# Patient Record
Sex: Female | Born: 1985 | Race: White | Hispanic: No | Marital: Single | Smoking: Former smoker
Health system: Southern US, Community
[De-identification: ages and names within clinical notes are randomized; demographics above are authoritative.]

## PROBLEM LIST (undated history)

## (undated) ENCOUNTER — Inpatient Hospital Stay: Payer: Self-pay

## (undated) DIAGNOSIS — N12 Tubulo-interstitial nephritis, not specified as acute or chronic: Secondary | ICD-10-CM

## (undated) DIAGNOSIS — E079 Disorder of thyroid, unspecified: Secondary | ICD-10-CM

## (undated) DIAGNOSIS — N200 Calculus of kidney: Secondary | ICD-10-CM

## (undated) DIAGNOSIS — S36113A Laceration of liver, unspecified degree, initial encounter: Secondary | ICD-10-CM

---

## 2004-07-31 ENCOUNTER — Emergency Department: Payer: Self-pay | Admitting: Emergency Medicine

## 2005-02-08 ENCOUNTER — Ambulatory Visit (HOSPITAL_COMMUNITY): Admission: RE | Admit: 2005-02-08 | Discharge: 2005-02-08 | Payer: Self-pay | Admitting: Gynecology

## 2005-02-22 ENCOUNTER — Ambulatory Visit (HOSPITAL_COMMUNITY): Admission: RE | Admit: 2005-02-22 | Discharge: 2005-02-22 | Payer: Self-pay | Admitting: Gynecology

## 2005-07-07 ENCOUNTER — Inpatient Hospital Stay (HOSPITAL_COMMUNITY): Admission: AD | Admit: 2005-07-07 | Discharge: 2005-07-10 | Payer: Self-pay | Admitting: Gynecology

## 2005-07-15 ENCOUNTER — Inpatient Hospital Stay (HOSPITAL_COMMUNITY): Admission: AD | Admit: 2005-07-15 | Discharge: 2005-07-15 | Payer: Self-pay | Admitting: Gynecology

## 2005-08-01 ENCOUNTER — Emergency Department: Payer: Self-pay | Admitting: Unknown Physician Specialty

## 2005-11-09 ENCOUNTER — Encounter: Payer: Self-pay | Admitting: Unknown Physician Specialty

## 2006-05-28 ENCOUNTER — Emergency Department: Payer: Self-pay | Admitting: Emergency Medicine

## 2006-06-04 ENCOUNTER — Emergency Department: Payer: Self-pay | Admitting: Unknown Physician Specialty

## 2009-05-29 ENCOUNTER — Emergency Department: Payer: Self-pay | Admitting: Emergency Medicine

## 2010-07-29 ENCOUNTER — Emergency Department: Payer: Self-pay | Admitting: Emergency Medicine

## 2010-07-30 ENCOUNTER — Emergency Department: Payer: Self-pay | Admitting: Emergency Medicine

## 2010-11-19 ENCOUNTER — Emergency Department: Payer: Self-pay | Admitting: Emergency Medicine

## 2010-12-28 ENCOUNTER — Emergency Department: Payer: Self-pay | Admitting: Emergency Medicine

## 2011-02-07 ENCOUNTER — Ambulatory Visit: Payer: Self-pay | Admitting: Family Medicine

## 2011-04-30 ENCOUNTER — Observation Stay: Payer: Self-pay

## 2011-05-17 ENCOUNTER — Observation Stay: Payer: Self-pay | Admitting: Obstetrics & Gynecology

## 2011-05-27 ENCOUNTER — Inpatient Hospital Stay: Payer: Self-pay | Admitting: Internal Medicine

## 2015-05-16 ENCOUNTER — Encounter: Payer: Self-pay | Admitting: Emergency Medicine

## 2015-05-16 ENCOUNTER — Emergency Department
Admission: EM | Admit: 2015-05-16 | Discharge: 2015-05-16 | Disposition: A | Discharge status: Home / Self Care | Payer: Self-pay | Attending: Emergency Medicine | Admitting: Emergency Medicine

## 2015-05-16 DIAGNOSIS — K0889 Other specified disorders of teeth and supporting structures: Secondary | ICD-10-CM

## 2015-05-16 DIAGNOSIS — Z720 Tobacco use: Secondary | ICD-10-CM | POA: Insufficient documentation

## 2015-05-16 DIAGNOSIS — K088 Other specified disorders of teeth and supporting structures: Secondary | ICD-10-CM | POA: Insufficient documentation

## 2015-05-16 DIAGNOSIS — K011 Impacted teeth: Secondary | ICD-10-CM | POA: Insufficient documentation

## 2015-05-16 DIAGNOSIS — K006 Disturbances in tooth eruption: Secondary | ICD-10-CM

## 2015-05-16 MED ORDER — KETOROLAC TROMETHAMINE 10 MG PO TABS: 10.0000 mg | ORAL_TABLET | Freq: Three times a day (TID) | ORAL | Status: DC

## 2015-05-16 MED ORDER — LIDOCAINE-EPINEPHRINE 2 %-1:100000 IJ SOLN
1.7000 mL | Freq: Once | INTRAMUSCULAR | Status: DC
  Filled 2015-05-16: qty 1.7

## 2015-05-16 NOTE — ED Provider Notes (Signed)
[facility] Emergency Department Provider Note ____________________________________________   Time seen: 1412  I have reviewed the triage vital signs and the nursing notes.  HISTORY  Chief Complaint  Dental Pain  HPI Maureen Herman is a 29 y.o. female reports to the ED for treatment of dental pain 1 week. She describes a right lower wisdom tooth with local gum swelling and tenderness. She denies any fracture, dental cavity, or abscess at this time. She reports pain is worse with chewing and biting. She is not found relief with salt water gargles, ibuprofen, or taking a friend's Vicodin. Here for treatment of this partially erupted wisdom tooth. She rates her pain at a 10/10 in triage.  History reviewed. No pertinent past medical history.  There are no active problems to display for this patient.  History reviewed. No pertinent past surgical history.  Current Outpatient Rx  Name  Route  Sig  Dispense  Refill  . ketorolac (TORADOL) 10 MG tablet   Oral   Take 1 tablet (10 mg total) by mouth every 8 (eight) hours.   15 tablet   0    Allergies Review of patient's allergies indicates no known allergies.  History reviewed. No pertinent family history.  Social History History  Substance Use Topics  . Smoking status: Heavy Tobacco Smoker -- 1.00 packs/day    Types: Cigarettes  . Smokeless tobacco: Not on file  . Alcohol Use: No   Review of Systems  Constitutional: Negative for fever. Eyes: Negative for visual changes. ENT: Negative for sore throat. Dental pain as above.  Cardiovascular: Negative for chest pain. Respiratory: Negative for shortness of breath. Gastrointestinal: Negative for abdominal pain, vomiting and diarrhea. Genitourinary: Negative for dysuria. Musculoskeletal: Negative for back pain. Skin: Negative for rash. Neurological: Negative for headaches, focal weakness or numbness. ____________________________________________  PHYSICAL  EXAM:  VITAL SIGNS: ED Triage Vitals  Enc Vitals Group     BP 05/16/15 1240 112/87 mmHg     Pulse Rate 05/16/15 1240 69     Resp 05/16/15 1240 16     Temp 05/16/15 1240 98.4 F (36.9 C)     Temp src --      SpO2 05/16/15 1240 99 %     Weight 05/16/15 1240 115 lb (52.164 kg)     Height 05/16/15 1240  (1.575 m)     Head Cir --      Peak Flow --      Pain Score 05/16/15 1241 10     Pain Loc --      Pain Edu? --      Excl. in GC? --    Constitutional: Alert and oriented. Well appearing and in no distress. Eyes: Conjunctivae are normal. PERRL. Normal extraocular movements. ENT   Head: Normocephalic and atraumatic.   Nose: No congestion/rhinnorhea.   Mouth/Throat: Mucous membranes are moist. Normal dentition without widespread decay. Right lower 3rd molar with local gum swelling and irritation. Overlying gum flap in place.    Neck: Supple. No thyromegaly. Hematological/Lymphatic/Immunilogical: No cervical lymphadenopathy. Cardiovascular: Normal rate, regular rhythm.  Respiratory: Normal respiratory effort. No wheezes/rales/rhonchi. Musculoskeletal: Nontender with normal range of motion in all extremities.  Neurologic:  Normal gait without ataxia. Normal speech and language. No gross focal neurologic deficits are appreciated. Skin:  Skin is warm, dry and intact. No rash noted. Psychiatric: Mood and affect are normal. Patient exhibits appropriate insight and judgment. ____________________________________________  PROCEDURES  Dental Block  Patient verbal consent obtained. Risks and benefits  reviewed.   Lidocaine 2% w/ epinephrine 1.7 ml  Dental block procedure performed to right lower 3rd molar  (#32)    Patient tolerated the procedure well. Adequate anesthesia obtained.   ____________________________________________  INITIAL IMPRESSION / ASSESSMENT AND PLAN / ED COURSE  Acute dental pain due to partially erupted 3rd molar. Acute pain management with  dental block. Referral to dental provider for definitive treatment. Prescription Toradol provided #15. ____________________________________________  FINAL CLINICAL IMPRESSION(S) / ED DIAGNOSES  Final diagnoses:  Unerupted tooth  Pain, dental     Lissa Hoard, PA-C 05/16/15 1444  Governor Rooks, MD 05/17/15 [phone]

## 2015-05-16 NOTE — ED Notes (Signed)
Right toothache, unable to see dentist until Monday.

## 2015-05-16 NOTE — Discharge Instructions (Signed)
Dental Pain A tooth ache may be caused by cavities (tooth decay). Cavities expose the nerve of the tooth to air and hot or cold temperatures. It may come from an infection or abscess (also called a boil or furuncle) around your tooth. It is also often caused by dental caries (tooth decay). This causes the pain you are having. DIAGNOSIS  Your caregiver can diagnose this problem by exam. TREATMENT   If caused by an infection, it may be treated with medications which kill germs (antibiotics) and pain medications as prescribed by your caregiver. Take medications as directed.  Only take over-the-counter or prescription medicines for pain, discomfort, or fever as directed by your caregiver.  Whether the tooth ache today is caused by infection or dental disease, you should see your dentist as soon as possible for further care. SEEK MEDICAL CARE IF: The exam and treatment you received today has been provided on an emergency basis only. This is not a substitute for complete medical or dental care. If your problem worsens or new problems (symptoms) appear, and you are unable to meet with your dentist, call or return to this location. SEEK IMMEDIATE MEDICAL CARE IF:   You have a fever.  You develop redness and swelling of your face, jaw, or neck.  You are unable to open your mouth.  You have severe pain uncontrolled by pain medicine. MAKE SURE YOU:   Understand these instructions.  Will watch your condition.  Will get help right away if you are not doing well or get worse. Document Released: 10/10/2005 Document Revised: 01/02/2012 Document Reviewed: 05/28/2008 [facility] Patient Information 2015 [place], [place]. This information is not intended to replace advice given to you by your health care provider. Make sure you discuss any questions you have with your health care provider.  Impacted Molar Molars are the teeth in the back of your mouth. You have 12 molars. There are 6 molars in each jaw, 3  on each side. When they grow in (erupt) they sometimes cause problems. Molars trapped inside the gum are impacted molars. Impacted molars may grow sideways, tilted, or may only partially emerge. Molars erupt at different times in life. The first set of molars usually erupts around 69 to 30 years of age. The second set of molars typically erupts around 46 to 30 years of age. The third set of molars are called wisdom teeth. These molars usually do not have enough space to erupt properly. Many teens and young adults develop impacted wisdom teeth and have them surgically removed (extracted). However, any molar or set of molars may become impacted. CAUSES  Teeth that are crowded are often the reason for an impacted molar, but sometimes a cyst or tumor may cause impaction of molars. SYMPTOMS  Sometimes there are no symptoms and an impacted molar is noticed during an exam or X-ray. If there are symptoms they may include:  Pain.  Swelling, redness, or inflammation near the impacted tooth or teeth.  Stiff jaw.  General feeling of illness.  Bad breath.  Gap between the teeth.  Difficulty opening your mouth.  Headache or jaw ache.  Swollen lymph nodes. Impacted teeth may increase the risk of complications such as:  Infection, with possible drainage around the infected area.  Damage to nearby teeth.  Growth of cysts.  Chronic discomfort. DIAGNOSIS  Impacted molars are diagnosed by oral exam and X-rays. TREATMENT  The goal of treatment is to obtain the best possible arrangement of your teeth. Your dentist or orthodontist  will recommend the best course of action for you. After an exam, your caregiver may recommend one or a combination of the following treatments.  Supportive home care to manage pain and other symptoms until treatment can be started.  Surgical extraction of one or a combination of molars to leave room for emerging or later molars. Teeth must be extracted at appropriate times  for the best results.  Surgical uncovering of tissue covering the impacted molar.  Orthodontic repositioning with the use of appliances such as elastic or metal separators, braces, wires, springs, and other removable or fixed devices. This is done to guide the molar and surrounding teeth to grow in properly. In some cases, you may need some surgery to assist this procedure. Follow-up orthodontic treatment is often necessary with impacted first and second molars.  Antibiotics to treat infection. HOME CARE INSTRUCTIONS Rinse as directed with an antibacterial solution or salt and warm water. Follow up with your caregiver as directed, even if you do not have symptoms. If you are waiting for treatment and have pain:  Take pain medicines as directed.  Take your antibiotics as directed. Finish them even if you start to feel better.  Put ice on the affected area.  Put ice in a plastic bag.  Place a towel between your skin and the bag.  Leave the ice on for 15-20 minutes, 03-04 times a day. SEEK DENTAL CARE IF:  You have a fever.  Pain emerges, worsens, or is not controlled by the medicines you were given.  Swelling occurs.  You have difficulty opening your mouth or swallowing. MAKE SURE YOU:   Understand these instructions.  Will watch your condition.  Will get help right away if you are not doing well or get worse. Document Released: 06/08/2011 Document Revised: 01/02/2012 Document Reviewed: 06/08/2011 [facility] Patient Information 2015 [place], [place]. This information is not intended to replace advice given to you by your health care provider. Make sure you discuss any questions you have with your health care provider.  OPTIONS FOR DENTAL FOLLOW UP CARE  [place] [facility] and [facility] - Local Safety Net Dental Clinics [email or web address]   [facility] [phone])  Sharl Ma  [phone])  [place] [phone])  [facility] [phone])  [facility] [phone]) This clinic caters to the indigent population and is on a lottery system. Location: [employer] [facility], [employer], [address], [place] Clinic Hours: Wednesdays from 6pm - 9pm, patients seen by a lottery system. For dates, call or go to [email or web address] Services: Cleanings, fillings and simple extractions. Payment Options: DENTAL WORK IS FREE OF CHARGE. Bring proof of income or support. Best way to get seen: Arrive at 5:15 pm - this is a lottery, NOT first come/first serve, so arriving earlier will not increase your chances of being seen.     [facility] [phone] Select option 1 for emergencies   Location: [facility], [place], [address], [place] Clinic Hours: No walk-ins accepted - call the day before to schedule an appointment. Check in times are 9:30 am and 1:30 pm. Services: Simple extractions, temporary fillings, pulpectomy/pulp debridement, uncomplicated abscess drainage. Payment Options: PAYMENT IS DUE AT THE TIME OF SERVICE.  Fee is usually $100-200, additional surgical procedures (e.g. abscess drainage) may be extra. Cash, checks, Visa/MasterCard accepted.  Can file Medicaid if patient is covered for dental - patient should call case worker to  check. No discount for [facility] patients. Best way to get seen: MUST call the day before and get onto the schedule. Can usually be seen the next 1-2 days. No walk-ins accepted.     [facility] [phone]   Location: [facility], [address] [place] Clinic Hours: M, W, Th, F 8am or 1:30pm, Tues 9a or 1:30 - first come/first served. Services: Simple extractions, temporary fillings, uncomplicated abscess drainage.  You do not need to be  an [facility] resident. Payment Options: PAYMENT IS DUE AT THE TIME OF SERVICE. Dental insurance, otherwise sliding scale - bring proof of income or support. Depending on income and treatment needed, cost is usually $50-200. Best way to get seen: Arrive early as it is first come/first served.     [facility] [phone]   Location: [address] Clinic Hours: Mon-Thu 8a-5p Services: Most basic dental services including extractions and fillings. Payment Options: PAYMENT IS DUE AT THE TIME OF SERVICE. Sliding scale, up to 50% off - bring proof if income or support. Medicaid with dental option accepted. Best way to get seen: Call to schedule an appointment, can usually be seen within 2 weeks OR they will try to see walk-ins - show up at 8a or 2p (you may have to wait).     [facility] [phone] [place] RESIDENTS ONLY   Location: [facility], [address] [place], [place] [address] Clinic Hours: By appointment only. Monday - Thursday 8am-5pm, Friday 8am-12pm Services: Cleanings, fillings, extractions. Payment Options: PAYMENT IS DUE AT THE TIME OF SERVICE. Cash, Visa or MasterCard. Sliding scale - $30 minimum per service. Best way to get seen: Come in to office, complete packet and make an appointment - need proof of income or support monies for each household member and proof of [facility] residence. Usually takes about a month to get in.     [facility] [phone]   Location: [address], [facility] Clinic Hours: Walk-in Urgent Care Dental Services are offered Monday-Friday mornings only. The numbers of emergencies accepted daily is limited to the number of providers available. Maximum 15 - Mondays, Wednesdays & Thursdays Maximum 10 - Tuesdays & Fridays Services: You do not need to be a [facility] resident to be seen for a  dental emergency. Emergencies are defined as pain, swelling, abnormal bleeding, or dental trauma. Walkins will receive x-rays if needed. NOTE: Dental cleaning is not an emergency. Payment Options: PAYMENT IS DUE AT THE TIME OF SERVICE. Minimum co-pay is $40.00 for uninsured patients. Minimum co-pay is $3.00 for Medicaid with dental coverage. Dental Insurance is accepted and must be presented at time of visit. Medicare does not cover dental. Forms of payment: Cash, credit card, checks. Best way to get seen: If not previously registered with the clinic, walk-in dental registration begins at 7:15 am and is on a first come/first serve basis. If previously registered with the clinic, call to make an appointment.     [facility] [phone] [place] RESIDENTS ONLY   Location: [address] [place], [place] Clinic Hours: Mon-Thu 10a-2p Services: Extractions only! Payment Options: FREE (donations accepted) - bring proof of income or support Best way to get seen: Call and schedule an appointment OR come at 8am on the 1st Monday of every month (except for holidays) when it is first come/first served.     [facility] [phone]   Location:  [address] [place], [place] Clinic Hours: Friday mornings Services, Payment Options, Best way to get seen: Call for info

## 2015-05-16 NOTE — ED Notes (Signed)
Dental pain x 1 week - needs dental referral as well

## 2015-05-16 NOTE — ED Notes (Signed)
Pt alert and oriented X4, active, cooperative, pt in NAD. RR even and unlabored, color WNL.  Pt informed to return if any life threatening symptoms occur.   

## 2016-01-02 ENCOUNTER — Emergency Department: Payer: Self-pay

## 2016-01-02 ENCOUNTER — Emergency Department
Admission: EM | Admit: 2016-01-02 | Discharge: 2016-01-02 | Disposition: A | Discharge status: Home / Self Care | Payer: Self-pay | Attending: Emergency Medicine | Admitting: Emergency Medicine

## 2016-01-02 DIAGNOSIS — Z791 Long term (current) use of non-steroidal anti-inflammatories (NSAID): Secondary | ICD-10-CM | POA: Insufficient documentation

## 2016-01-02 DIAGNOSIS — N12 Tubulo-interstitial nephritis, not specified as acute or chronic: Secondary | ICD-10-CM

## 2016-01-02 DIAGNOSIS — N10 Acute tubulo-interstitial nephritis: Secondary | ICD-10-CM | POA: Insufficient documentation

## 2016-01-02 DIAGNOSIS — T39091A Poisoning by salicylates, accidental (unintentional), initial encounter: Secondary | ICD-10-CM | POA: Insufficient documentation

## 2016-01-02 DIAGNOSIS — F17210 Nicotine dependence, cigarettes, uncomplicated: Secondary | ICD-10-CM | POA: Insufficient documentation

## 2016-01-02 LAB — CBC WITH DIFFERENTIAL/PLATELET
BASOS PCT: 0 %
Basophils Absolute: 0 10*3/uL (ref 0–0.1)
EOS ABS: 0.1 10*3/uL (ref 0–0.7)
Eosinophils Relative: 0 %
HEMATOCRIT: 36.7 % (ref 35.0–47.0)
HEMOGLOBIN: 11.9 g/dL — AB (ref 12.0–16.0)
LYMPHS ABS: 1.9 10*3/uL (ref 1.0–3.6)
Lymphocytes Relative: 14 %
MCH: 28 pg (ref 26.0–34.0)
MCHC: 32.4 g/dL (ref 32.0–36.0)
MCV: 86.6 fL (ref 80.0–100.0)
Monocytes Absolute: 1.4 10*3/uL — ABNORMAL HIGH (ref 0.2–0.9)
Monocytes Relative: 11 %
NEUTROS ABS: 10.1 10*3/uL — AB (ref 1.4–6.5)
NEUTROS PCT: 75 %
Platelets: 322 10*3/uL (ref 150–440)
RBC: 4.24 MIL/uL (ref 3.80–5.20)
RDW: 17.2 % — ABNORMAL HIGH (ref 11.5–14.5)
WBC: 13.5 10*3/uL — AB (ref 3.6–11.0)

## 2016-01-02 LAB — URINALYSIS COMPLETE WITH MICROSCOPIC (ARMC ONLY)
BILIRUBIN URINE: NEGATIVE
Glucose, UA: NEGATIVE mg/dL
Nitrite: POSITIVE — AB
PH: 5 (ref 5.0–8.0)
PROTEIN: 100 mg/dL — AB
Specific Gravity, Urine: 1.021 (ref 1.005–1.030)

## 2016-01-02 LAB — BASIC METABOLIC PANEL
ANION GAP: 7 (ref 5–15)
BUN: 14 mg/dL (ref 6–20)
CHLORIDE: 107 mmol/L (ref 101–111)
CO2: 22 mmol/L (ref 22–32)
Calcium: 8.9 mg/dL (ref 8.9–10.3)
Creatinine, Ser: 0.74 mg/dL (ref 0.44–1.00)
GFR calc non Af Amer: >60 mL/min (ref >60)
Glucose, Bld: 90 mg/dL (ref 65–99)
POTASSIUM: 3.5 mmol/L (ref 3.5–5.1)
SODIUM: 136 mmol/L (ref 135–145)

## 2016-01-02 LAB — ACETAMINOPHEN LEVEL

## 2016-01-02 LAB — POCT PREGNANCY, URINE: Preg Test, Ur: NEGATIVE

## 2016-01-02 LAB — SALICYLATE LEVEL: SALICYLATE LVL: 31.8 mg/dL — AB (ref 2.8–30.0)

## 2016-01-02 MED ORDER — KETOROLAC TROMETHAMINE 30 MG/ML IJ SOLN
30.0000 mg | Freq: Once | INTRAMUSCULAR | Status: AC
  Administered 2016-01-02: 30 mg via INTRAVENOUS
  Filled 2016-01-02: qty 1

## 2016-01-02 MED ORDER — SODIUM CHLORIDE 0.9 % IV BOLUS (SEPSIS)
1000.0000 mL | Freq: Once | INTRAVENOUS | Status: AC
  Administered 2016-01-02: 1000 mL via INTRAVENOUS

## 2016-01-02 MED ORDER — CEPHALEXIN 500 MG PO CAPS: 500.0000 mg | ORAL_CAPSULE | Freq: Three times a day (TID) | ORAL | Status: AC

## 2016-01-02 MED ORDER — DEXTROSE 5 % IV SOLN
1.0000 g | INTRAVENOUS | Status: DC
  Administered 2016-01-02: 1 g via INTRAVENOUS
  Filled 2016-01-02: qty 10

## 2016-01-02 NOTE — ED Notes (Signed)
Patient left for ultrasound.

## 2016-01-02 NOTE — Discharge Instructions (Signed)
Return immediately for further blood testing for aspirin overdose. Aspirin Overdose Aspirin is a medicine that is commonly used to treat fever, pain, and inflammation. It is found in many prescription medicines and over-the-counter medicines, including:  Pain medicines.  Cold medicines.  Medicines for diarrhea or upset stomach.  Herbal medicines. A person who has overdosed on aspirin has taken too much aspirin or too much of a medicine that is like aspirin. He or she may have taken too much at one time (acute overdose) or over a long period of time (chronic overdose). An aspirin overdose can result in serious health problems, such as vomiting and organ failure. RISK FACTORS Risk factors of an aspirin overdose include:  Being elderly.  Not drinking enough fluids.  Becoming overheated.  Having a kidney problem. SYMPTOMS Symptoms of an aspirin overdose include:  Nausea and vomiting.  Fever.  Ringing in the ears.  Confusion.  Hyperactivity.  Fatigue.  Rapid breathing or difficulty breathing.  Low blood pressure.  Seizure.  Loss of consciousness. DIAGNOSIS An aspirin overdose can be diagnosed based on:  Symptoms.  A history of having taken aspirin.  A physical exam.  Tests. These may check:  The amount of aspirin in the blood.  Oxygen in the blood (ABG).  Acid levels (pH) in the blood and urine.  Blood minerals (electrolytes).  Blood sugar (glucose).  The amount of protein in the blood and urine. TREATMENT Treatment depends on how severe the overdose is, when aspirin was last taken, and how much was taken. Treatment may involve:  Activated charcoal to absorb aspirin in the stomach. The charcoal may be given to swallow or given through a tube that goes from the nose to the stomach (nasogastric tube).  IV fluids and medicines to correct blood pH and restore electrolyte balance.  Antiseizure medicines.  Laxatives to help remove aspirin from the  body.  Cooling the body with an ice bath to bring down fever.  A procedure to filter the aspirin from the blood (hemodialysis). This may be needed in severe cases. HOME CARE INSTRUCTIONS  Drink enough fluid to keep your urine clear or pale yellow.  Tell your health care provider if you continue to have symptoms of an aspirin overdose. PREVENTION  Carefully read the labels of all medicines that you take. These include prescription medicines, over-the-counter medicines, herbal supplements, and vitamins.  Check with your health care provider or pharmacist before you start any new medicine or supplement.   This information is not intended to replace advice given to you by your health care provider. Make sure you discuss any questions you have with your health care provider.   Document Released: 11/17/2004 Document Revised: 10/31/2014 Document Reviewed: 07/29/2014 Elsevier Interactive Patient Education 2016 Elsevier Inc.  Pyelonephritis, Adult Pyelonephritis is a kidney infection. The kidneys are organs that help clean your blood by moving waste out of your blood and into your pee (urine). This infection can happen quickly, or it can last for a long time. In most cases, it clears up with treatment and does not cause other problems. HOME CARE Medicines  Take over-the-counter and prescription medicines only as told by your doctor.  Take your antibiotic medicine as told by your doctor. Do not stop taking the medicine even if you start to feel better. General Instructions  Drink enough fluid to keep your pee clear or pale yellow.  Avoid caffeine, tea, and carbonated drinks.  Pee (urinate) often. Avoid holding in pee for long periods of time.  Pee before and after sex.  After pooping (having a bowel movement), women should wipe from front to back. Use each tissue only once.  Keep all follow-up visits as told by your doctor. This is important. GET HELP IF:  You do not feel better  after 2 days.  Your symptoms get worse.  You have a fever. GET HELP RIGHT AWAY IF:  You cannot take your medicine or drink fluids as told.  You have chills and shaking.  You throw up (vomit).  You have very bad pain in your side (flank) or back.  You feel very weak or you pass out (faint).   This information is not intended to replace advice given to you by your health care provider. Make sure you discuss any questions you have with your health care provider.   Document Released: 11/17/2004 Document Revised: 07/01/2015 Document Reviewed: 02/02/2015 Elsevier Interactive Patient Education [employer] Elsevier Inc.

## 2016-01-02 NOTE — ED Notes (Signed)
Left flank pain for 2 weeks that comes and goes. Denies vaginal discharge or burning with urination. Hx of kidney stones

## 2016-01-02 NOTE — ED Provider Notes (Addendum)
[facility] Emergency Department Provider Note  ____________________________________________  Time seen: Approximately 1145PM  I have reviewed the triage vital signs and the nursing notes.   HISTORY  Chief Complaint Flank Pain    HPI Maureen Herman is a 30 y.o. female with a history of kidney infection as well as kidney stones who is presenting with left flank pain. She says that last week she felt that she had a urinary tract infection was having urinary frequency but take over-the-counter Azo and the symptoms resolved. However, she says that she has had left flank pain which is of a stabbing quality. She says it has been slowly increasing over the past 2 weeks. She denies any vaginal bleeding or discharge at this time. Says that she has never needed any sort of intervention for her kidney stones.  Patient says that these symptoms feel different from her previous kidney stones because of the pain being ongoing and progressive. Says that the last time she had stones the pain was of a sudden onset quality.   No past medical history on file.  There are no active problems to display for this patient.   No past surgical history on file.  Current Outpatient Rx  Name  Route  Sig  Dispense  Refill  . ketorolac (TORADOL) 10 MG tablet   Oral   Take 1 tablet (10 mg total) by mouth every 8 (eight) hours.   15 tablet   0     Allergies Review of patient's allergies indicates no known allergies.  No family history on file.  Social History Social History  Substance Use Topics  . Smoking status: Heavy Tobacco Smoker -- 1.00 packs/day    Types: Cigarettes  . Smokeless tobacco: Not on file  . Alcohol Use: No    Review of Systems Constitutional: No fever/chills Eyes: No visual changes. ENT: No sore throat. Cardiovascular: Denies chest pain. Respiratory: Denies shortness of breath. Gastrointestinal: No abdominal pain.  No nausea, no vomiting.  No diarrhea.   No constipation. Genitourinary: Negative for dysuria. Musculoskeletal: Left flank pain. Skin: Negative for rash. Neurological: Negative for headaches, focal weakness or numbness.  10-point ROS otherwise negative.  ____________________________________________   PHYSICAL EXAM:  VITAL SIGNS: ED Triage Vitals  Enc Vitals Group     BP 01/02/16 1114 135/76 mmHg     Pulse Rate 01/02/16 1114 111     Resp 01/02/16 1114 20     Temp 01/02/16 1114 98.2 F (36.8 C)     Temp Source 01/02/16 1114 Oral     SpO2 01/02/16 1114 98 %     Weight 01/02/16 1114 100 lb (45.36 kg)     Height 01/02/16 1114  (1.575 m)     Head Cir --      Peak Flow --      Pain Score 01/02/16 1204 6     Pain Loc --      Pain Edu? --      Excl. in GC? --     Constitutional: Alert and oriented. Well appearing and in no acute distress. Eyes: Conjunctivae are normal. PERRL. EOMI. Head: Atraumatic. Nose: No congestion/rhinnorhea. Mouth/Throat: Mucous membranes are moist.  Neck: No stridor.   Cardiovascular: Normal rate, regular rhythm. Grossly normal heart sounds.  Good peripheral circulation. Respiratory: Normal respiratory effort.  No retractions. Lungs CTAB. Gastrointestinal: Soft and nontender. No distention. No abdominal bruits. Left sided CVA tenderness to palpation. Musculoskeletal: No lower extremity tenderness nor edema.  No joint  effusions. Neurologic:  Normal speech and language. No gross focal neurologic deficits are appreciated.  Skin:  Skin is warm, dry and intact. No rash noted. Psychiatric: Mood and affect are normal. Speech and behavior are normal.  ____________________________________________   LABS (all labs ordered are listed, but only abnormal results are displayed)  Labs Reviewed  URINALYSIS COMPLETEWITH MICROSCOPIC ([facility] ONLY) - Abnormal; Notable for the following:    Color, Urine YELLOW (*)    APPearance CLOUDY (*)    Ketones, ur TRACE (*)    Hgb urine dipstick 1+ (*)     Protein, ur 100 (*)    Nitrite POSITIVE (*)    Leukocytes, UA 1+ (*)    Bacteria, UA RARE (*)    Squamous Epithelial / LPF 6-30 (*)    All other components within normal limits  CBC WITH DIFFERENTIAL/PLATELET - Abnormal; Notable for the following:    WBC 13.5 (*)    Hemoglobin 11.9 (*)    RDW 17.2 (*)    Neutro Abs 10.1 (*)    Monocytes Absolute 1.4 (*)    All other components within normal limits  SALICYLATE LEVEL - Abnormal; Notable for the following:    Salicylate Lvl 31.8 (*)    All other components within normal limits  ACETAMINOPHEN LEVEL - Abnormal; Notable for the following:    Acetaminophen (Tylenol), Serum <10 (*)    All other components within normal limits  URINE CULTURE  BASIC METABOLIC PANEL  SALICYLATE LEVEL  POC URINE PREG, ED  POCT PREGNANCY, URINE   ____________________________________________  EKG   ____________________________________________  RADIOLOGY   IMPRESSION: No evidence of hydronephrosis.  Questionable cortical thinning of the bilateral kidneys with hyperechoic appearance of the kidney pyramids. Ultrasound characteristics are nonspecific, and could potentially relate to chronic inflammation/infection, primary renal abnormality (such as nephrocalcinosis/medullary sponge kidney) or other metabolic problems. If the patient has not yet been evaluated by nephrology, referral may be considered.  Signed,  Yvone Neu. Loreta Ave, DO  [facility]  [facility]   Electronically Signed By: Gilmer Mor D.O. On: 01/02/2016 12:47       ____________________________________________   PROCEDURES   ____________________________________________   INITIAL IMPRESSION / ASSESSMENT AND PLAN / ED COURSE  Pertinent labs & imaging results that were available during my care of the patient were reviewed by me and considered in my medical decision making (see chart for  details).  ----------------------------------------- 3:15 PM on 01/02/2016 -----------------------------------------  Patient with elevated salicylate level. Salicylate level sent because the patient admitted to taking 18 BC packets over the past 24 hours because of her pain. Ulcer was called who recommended a 6 hour level for repeat study. However, the patient says that she has to leave to pick up her children. She says that she was unaware that the [facility] powder could hurt her. I counseled her regarding her lab as well as imaging results. She knows that if she does not stay for further testing that this may make her susceptible to kidney failure, dialysis, permanent disability or death. Despite this she said that she must go pick up her children. She says that she will be able to return as soon as possible and effort before 6 PM to have her levels redrawn. She is finishing her Rocephin now. She will be discharged with Keflex for her pyelonephritis. Unlikely to be infected kidney stone. Symptoms are consistent with pyelonephritis. No sudden onset of pain as the patient has had in the past with her kidney  stones. Also, patient is calm in the bed. Has not had any pain medications in the emergency department. Is not having any difficulty finding a position of comfort. ____________________________________________   FINAL CLINICAL IMPRESSION(S) / ED DIAGNOSES  Elevated salicylate level. Pyelonephritis.    Myrna Blazeravid Matthew Kayde Atkerson, MD 01/02/16 1517  Prior to discharge the patient was fully awake and alert. She was not clinically intoxicated and had decisional capacity to make medical decisions. She was aware of all the risks of leaving AGAINST MEDICAL ADVICE.  Myrna Blazeravid Matthew Hildreth Orsak, MD 01/02/16 [phone]

## 2016-01-02 NOTE — ED Notes (Signed)
Patient states she needs to leave to pick up her daughter. Dr. Pershing ProudSchaevitz is aware.

## 2016-01-02 NOTE — ED Notes (Signed)
Patient states she had burning with urination last week, but that was resolved with OTC meds. Patient states OTC BC helps with the left flank pain. Patient states she has taken approximately 18 in the last 24 hours.

## 2016-01-04 LAB — URINE CULTURE

## 2016-01-09 ENCOUNTER — Emergency Department: Payer: Self-pay

## 2016-01-09 ENCOUNTER — Ambulatory Visit (HOSPITAL_COMMUNITY)
Admission: AD | Admit: 2016-01-09 | Discharge: 2016-01-09 | Disposition: A | Discharge status: Home / Self Care | Payer: No Typology Code available for payment source | Source: Other Acute Inpatient Hospital | Attending: Emergency Medicine | Admitting: Emergency Medicine

## 2016-01-09 ENCOUNTER — Emergency Department
Admission: EM | Admit: 2016-01-09 | Discharge: 2016-01-09 | Disposition: A | Discharge status: Other Acute Inpatient Hospital | Payer: Self-pay | Attending: Emergency Medicine | Admitting: Emergency Medicine

## 2016-01-09 DIAGNOSIS — Y998 Other external cause status: Secondary | ICD-10-CM | POA: Insufficient documentation

## 2016-01-09 DIAGNOSIS — S36113A Laceration of liver, unspecified degree, initial encounter: Secondary | ICD-10-CM | POA: Diagnosis present

## 2016-01-09 DIAGNOSIS — Y9389 Activity, other specified: Secondary | ICD-10-CM | POA: Insufficient documentation

## 2016-01-09 DIAGNOSIS — F1099 Alcohol use, unspecified with unspecified alcohol-induced disorder: Secondary | ICD-10-CM | POA: Insufficient documentation

## 2016-01-09 DIAGNOSIS — Y92410 Unspecified street and highway as the place of occurrence of the external cause: Secondary | ICD-10-CM | POA: Insufficient documentation

## 2016-01-09 DIAGNOSIS — S36115A Moderate laceration of liver, initial encounter: Secondary | ICD-10-CM | POA: Insufficient documentation

## 2016-01-09 DIAGNOSIS — F17210 Nicotine dependence, cigarettes, uncomplicated: Secondary | ICD-10-CM | POA: Insufficient documentation

## 2016-01-09 LAB — COMPREHENSIVE METABOLIC PANEL
ALBUMIN: 3.7 g/dL (ref 3.5–5.0)
ALK PHOS: 58 U/L (ref 38–126)
ALT: 515 U/L — AB (ref 14–54)
AST: 676 U/L — AB (ref 15–41)
Anion gap: 13 (ref 5–15)
BUN: 11 mg/dL (ref 6–20)
CALCIUM: 8.4 mg/dL — AB (ref 8.9–10.3)
CHLORIDE: 106 mmol/L (ref 101–111)
CO2: 20 mmol/L — AB (ref 22–32)
CREATININE: 0.7 mg/dL (ref 0.44–1.00)
GFR calc non Af Amer: >60 mL/min (ref >60)
GLUCOSE: 75 mg/dL (ref 65–99)
Potassium: 2.7 mmol/L — CL (ref 3.5–5.1)
SODIUM: 139 mmol/L (ref 135–145)
Total Bilirubin: 0.5 mg/dL (ref 0.3–1.2)
Total Protein: 7.3 g/dL (ref 6.5–8.1)

## 2016-01-09 LAB — ETHANOL: Alcohol, Ethyl (B): 257 mg/dL — ABNORMAL HIGH (ref <5)

## 2016-01-09 LAB — TYPE AND SCREEN
ABO/RH(D): O NEG
ANTIBODY SCREEN: NEGATIVE
UNIT DIVISION: 0

## 2016-01-09 LAB — CBC
HCT: 36.1 % (ref 35.0–47.0)
HEMOGLOBIN: 11.6 g/dL — AB (ref 12.0–16.0)
MCH: 28.1 pg (ref 26.0–34.0)
MCHC: 32.3 g/dL (ref 32.0–36.0)
MCV: 87 fL (ref 80.0–100.0)
PLATELETS: 482 10*3/uL — AB (ref 150–440)
RBC: 4.14 MIL/uL (ref 3.80–5.20)
RDW: 16.5 % — ABNORMAL HIGH (ref 11.5–14.5)
WBC: 28.5 10*3/uL — AB (ref 3.6–11.0)

## 2016-01-09 LAB — SALICYLATE LEVEL

## 2016-01-09 LAB — PREPARE RBC (CROSSMATCH)

## 2016-01-09 LAB — ABO/RH: ABO/RH(D): O NEG

## 2016-01-09 LAB — ACETAMINOPHEN LEVEL: Acetaminophen (Tylenol), Serum: <10 ug/mL — ABNORMAL LOW (ref 10–30)

## 2016-01-09 MED ORDER — POTASSIUM CHLORIDE 10 MEQ/100ML IV SOLN
10.0000 meq | INTRAVENOUS | Status: DC
  Administered 2016-01-09: 10 meq via INTRAVENOUS
  Filled 2016-01-09 (×2): qty 100

## 2016-01-09 MED ORDER — SODIUM CHLORIDE 0.9 % IV BOLUS (SEPSIS)
1000.0000 mL | Freq: Once | INTRAVENOUS | Status: AC
  Administered 2016-01-09: 1000 mL via INTRAVENOUS

## 2016-01-09 MED ORDER — LORAZEPAM 2 MG/ML IJ SOLN
1.0000 mg | Freq: Once | INTRAMUSCULAR | Status: AC
  Administered 2016-01-09: 1 mg via INTRAVENOUS

## 2016-01-09 MED ORDER — LORAZEPAM 2 MG/ML IJ SOLN
1.0000 mg | Freq: Once | INTRAMUSCULAR | Status: AC
  Administered 2016-01-09: 1 mg via INTRAVENOUS
  Filled 2016-01-09: qty 1

## 2016-01-09 MED ORDER — SODIUM CHLORIDE 0.9 % IV SOLN: 10.0000 mL/h | Freq: Once | INTRAVENOUS | Status: DC

## 2016-01-09 MED ORDER — LORAZEPAM 2 MG/ML IJ SOLN
INTRAMUSCULAR | Status: AC
  Filled 2016-01-09: qty 1

## 2016-01-09 MED ORDER — IOHEXOL 350 MG/ML SOLN
100.0000 mL | Freq: Once | INTRAVENOUS | Status: AC | PRN
  Administered 2016-01-09: 100 mL via INTRAVENOUS

## 2016-01-09 MED ORDER — MORPHINE SULFATE (PF) 4 MG/ML IV SOLN
4.0000 mg | Freq: Once | INTRAVENOUS | Status: AC
  Administered 2016-01-09: 4 mg via INTRAVENOUS
  Filled 2016-01-09: qty 1

## 2016-01-09 NOTE — ED Notes (Signed)
Patient transported to CT 

## 2016-01-09 NOTE — ED Notes (Signed)
Patient returned to room.  Unable to have CT performed due to patient movement.

## 2016-01-09 NOTE — ED Notes (Signed)
Dr. Frances MaywoodA Webster notified of critical lab value - Potassium 2.7.  Acknowledged, no new order received.

## 2016-01-09 NOTE — ED Notes (Signed)
Report given to [facility], RN

## 2016-01-09 NOTE — ED Notes (Signed)
[facility]

## 2016-01-09 NOTE — ED Notes (Signed)
Type and screen sent to lab.

## 2016-01-09 NOTE — ED Notes (Signed)
[facility] at bedside 

## 2016-01-09 NOTE — ED Notes (Signed)
Necklace - silver colored chain, owl shaped charm with clear stones at the eyes and black colored stone in the center. Belly button piercing - silver colored with silver colored ball at the top and white colored ball with clear colored stones on the bottom. Placed in specimen cup and placed with patient's other belongings.
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
## 2016-02-09 ENCOUNTER — Encounter: Payer: Self-pay | Admitting: Emergency Medicine

## 2016-02-09 ENCOUNTER — Emergency Department
Admission: EM | Admit: 2016-02-09 | Discharge: 2016-02-09 | Disposition: A | Discharge status: Home / Self Care | Payer: No Typology Code available for payment source | Attending: Emergency Medicine | Admitting: Emergency Medicine

## 2016-02-09 ENCOUNTER — Emergency Department: Payer: No Typology Code available for payment source

## 2016-02-09 DIAGNOSIS — R0789 Other chest pain: Secondary | ICD-10-CM

## 2016-02-09 DIAGNOSIS — F17210 Nicotine dependence, cigarettes, uncomplicated: Secondary | ICD-10-CM | POA: Insufficient documentation

## 2016-02-09 HISTORY — DX: Disorder of thyroid, unspecified: E07.9

## 2016-02-09 LAB — POCT PREGNANCY, URINE: Preg Test, Ur: NEGATIVE

## 2016-02-09 MED ORDER — KETOROLAC TROMETHAMINE 30 MG/ML IJ SOLN
30.0000 mg | Freq: Once | INTRAMUSCULAR | Status: AC
  Administered 2016-02-09: 30 mg via INTRAMUSCULAR
  Filled 2016-02-09: qty 1

## 2016-02-09 MED ORDER — DIAZEPAM 2 MG PO TABS
2.0000 mg | ORAL_TABLET | Freq: Once | ORAL | Status: AC
  Administered 2016-02-09: 2 mg via ORAL
  Filled 2016-02-09: qty 1
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
## 2016-02-09 NOTE — Discharge Instructions (Signed)

## 2016-03-09 ENCOUNTER — Emergency Department: Payer: Self-pay

## 2016-03-09 ENCOUNTER — Emergency Department
Admission: EM | Admit: 2016-03-09 | Discharge: 2016-03-09 | Disposition: A | Discharge status: Home / Self Care | Payer: Self-pay | Attending: Emergency Medicine | Admitting: Emergency Medicine

## 2016-03-09 ENCOUNTER — Encounter: Payer: Self-pay | Admitting: Emergency Medicine

## 2016-03-09 DIAGNOSIS — R1010 Upper abdominal pain, unspecified: Secondary | ICD-10-CM

## 2016-03-09 DIAGNOSIS — F1290 Cannabis use, unspecified, uncomplicated: Secondary | ICD-10-CM | POA: Insufficient documentation

## 2016-03-09 DIAGNOSIS — R0789 Other chest pain: Secondary | ICD-10-CM

## 2016-03-09 DIAGNOSIS — R112 Nausea with vomiting, unspecified: Secondary | ICD-10-CM

## 2016-03-09 DIAGNOSIS — R067 Sneezing: Secondary | ICD-10-CM | POA: Insufficient documentation

## 2016-03-09 DIAGNOSIS — R1011 Right upper quadrant pain: Secondary | ICD-10-CM | POA: Insufficient documentation

## 2016-03-09 DIAGNOSIS — Z87891 Personal history of nicotine dependence: Secondary | ICD-10-CM | POA: Insufficient documentation

## 2016-03-09 LAB — CBC WITH DIFFERENTIAL/PLATELET
BASOS ABS: 0 10*3/uL (ref 0–0.1)
BASOS PCT: 1 %
EOS ABS: 0.1 10*3/uL (ref 0–0.7)
Eosinophils Relative: 2 %
HEMATOCRIT: 35.4 % (ref 35.0–47.0)
HEMOGLOBIN: 11.3 g/dL — AB (ref 12.0–16.0)
Lymphocytes Relative: 26 %
Lymphs Abs: 1.4 10*3/uL (ref 1.0–3.6)
MCH: 28.1 pg (ref 26.0–34.0)
MCHC: 32 g/dL (ref 32.0–36.0)
MCV: 87.8 fL (ref 80.0–100.0)
MONO ABS: 0.5 10*3/uL (ref 0.2–0.9)
MONOS PCT: 8 %
NEUTROS ABS: 3.4 10*3/uL (ref 1.4–6.5)
NEUTROS PCT: 63 %
Platelets: 312 10*3/uL (ref 150–440)
RBC: 4.03 MIL/uL (ref 3.80–5.20)
RDW: 16.6 % — AB (ref 11.5–14.5)
WBC: 5.4 10*3/uL (ref 3.6–11.0)

## 2016-03-09 LAB — COMPREHENSIVE METABOLIC PANEL
ALK PHOS: 67 U/L (ref 38–126)
ALT: 15 U/L (ref 14–54)
ANION GAP: 7 (ref 5–15)
AST: 17 U/L (ref 15–41)
Albumin: 4.2 g/dL (ref 3.5–5.0)
BILIRUBIN TOTAL: 0.5 mg/dL (ref 0.3–1.2)
BUN: 15 mg/dL (ref 6–20)
CALCIUM: 9 mg/dL (ref 8.9–10.3)
CO2: 24 mmol/L (ref 22–32)
CREATININE: 0.74 mg/dL (ref 0.44–1.00)
Chloride: 106 mmol/L (ref 101–111)
GFR calc non Af Amer: >60 mL/min (ref >60)
Glucose, Bld: 95 mg/dL (ref 65–99)
Potassium: 3.7 mmol/L (ref 3.5–5.1)
SODIUM: 137 mmol/L (ref 135–145)
TOTAL PROTEIN: 7.6 g/dL (ref 6.5–8.1)

## 2016-03-09 MED ORDER — KETOROLAC TROMETHAMINE 30 MG/ML IJ SOLN
INTRAMUSCULAR | Status: AC
  Administered 2016-03-09: 30 mg via INTRAMUSCULAR
  Filled 2016-03-09: qty 1

## 2016-03-09 MED ORDER — KETOROLAC TROMETHAMINE 30 MG/ML IJ SOLN
30.0000 mg | Freq: Once | INTRAMUSCULAR | Status: AC
  Administered 2016-03-09: 30 mg via INTRAMUSCULAR

## 2016-03-09 MED ORDER — ONDANSETRON HCL 4 MG/2ML IJ SOLN
4.0000 mg | Freq: Once | INTRAMUSCULAR | Status: AC
  Administered 2016-03-09: 4 mg via INTRAVENOUS
  Filled 2016-03-09: qty 2

## 2016-03-09 MED ORDER — ONDANSETRON 4 MG PO TBDP: 4.0000 mg | ORAL_TABLET | Freq: Three times a day (TID) | ORAL | Status: AC | PRN

## 2016-03-09 MED ORDER — HYDROCODONE-ACETAMINOPHEN 5-325 MG PO TABS: 1.0000 | ORAL_TABLET | Freq: Four times a day (QID) | ORAL | Status: AC | PRN

## 2016-03-09 NOTE — ED Notes (Signed)
AAOx3.  Skin warm and dry.  NAD 
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
## 2016-03-09 NOTE — ED Notes (Signed)
Return from [facility].  Ambulates with easy and steady gait.

## 2016-03-09 NOTE — ED Notes (Signed)
States she sneezed a couple days ago    Felt a pop to right lateral rib area  Increased  pain with movement or inspiration

## 2016-03-09 NOTE — ED Notes (Signed)
RUQ abd pain , with nausea and vomiting pain started after sneezing.

## 2016-03-09 NOTE — Discharge Instructions (Signed)
Please schedule an appointment with Dr Marva PandaSkulskie for follow up ASAP.   Please reschedule and attend [facility]   Chest Wall Pain Chest wall pain is pain in or around the bones and muscles of your chest. Sometimes, an injury causes this pain. Sometimes, the cause may not be known. This pain may take several weeks or longer to get better. HOME CARE INSTRUCTIONS  Pay attention to any changes in your symptoms. Take these actions to help with your pain:   Rest as told by your health care provider.   Avoid activities that cause pain. These include any activities that use your chest muscles or your abdominal and side muscles to lift heavy items.   If directed, apply ice to the painful area:  Put ice in a plastic bag.  Place a towel between your skin and the bag.  Leave the ice on for 20 minutes, 2-3 times per day.  Take over-the-counter and prescription medicines only as told by your health care provider.  Do not use tobacco products, including cigarettes, chewing tobacco, and e-cigarettes. If you need help quitting, ask your health care provider.  Keep all follow-up visits as told by your health care provider. This is important. SEEK MEDICAL CARE IF:  You have a fever.  Your chest pain becomes worse.  You have new symptoms. SEEK IMMEDIATE MEDICAL CARE IF:  You have nausea or vomiting.  You feel sweaty or light-headed.  You have a cough with phlegm (sputum) or you cough up blood.  You develop shortness of breath.   This information is not intended to replace advice given to you by your health care provider. Make sure you discuss any questions you have with your health care provider.   Document Released: 10/10/2005 Document Revised: 07/01/2015 Document Reviewed: 01/05/2015 Elsevier Interactive Patient Education 2016 [employer]  Nausea and Vomiting Nausea is a sick feeling that often comes before throwing up (vomiting). Vomiting is a  reflex where stomach contents come out of your mouth. Vomiting can cause severe loss of body fluids (dehydration). Children and elderly adults can become dehydrated quickly, especially if they also have diarrhea. Nausea and vomiting are symptoms of a condition or disease. It is important to find the cause of your symptoms. CAUSES   Direct irritation of the stomach lining. This irritation can result from increased acid production (gastroesophageal reflux disease), infection, food poisoning, taking certain medicines (such as nonsteroidal anti-inflammatory drugs), alcohol use, or tobacco use.  Signals from the brain.These signals could be caused by a headache, heat exposure, an inner ear disturbance, increased pressure in the brain from injury, infection, a tumor, or a concussion, pain, emotional stimulus, or metabolic problems.  An obstruction in the gastrointestinal tract (bowel obstruction).  Illnesses such as diabetes, hepatitis, gallbladder problems, appendicitis, kidney problems, cancer, sepsis, atypical symptoms of a heart attack, or eating disorders.  Medical treatments such as chemotherapy and radiation.  Receiving medicine that makes you sleep (general anesthetic) during surgery. DIAGNOSIS Your caregiver may ask for tests to be done if the problems do not improve after a few days. Tests may also be done if symptoms are severe or if the reason for the nausea and vomiting is not clear. Tests may include:  Urine tests.  Blood tests.  Stool tests.  Cultures (to look for evidence of infection).  X-rays or other imaging studies. Test results can help your caregiver make decisions about treatment or the need for additional tests. TREATMENT You need  to stay well hydrated. Drink frequently but in small amounts.You may wish to drink water, sports drinks, clear broth, or eat frozen ice pops or gelatin dessert to help stay hydrated.When you eat, eating slowly may help prevent nausea.There  are also some antinausea medicines that may help prevent nausea. HOME CARE INSTRUCTIONS   Take all medicine as directed by your caregiver.  If you do not have an appetite, do not force yourself to eat. However, you must continue to drink fluids.  If you have an appetite, eat a normal diet unless your caregiver tells you differently.  Eat a variety of complex carbohydrates (rice, wheat, potatoes, bread), lean meats, yogurt, fruits, and vegetables.  Avoid high-fat foods because they are more difficult to digest.  Drink enough water and fluids to keep your urine clear or pale yellow.  If you are dehydrated, ask your caregiver for specific rehydration instructions. Signs of dehydration may include:  Severe thirst.  Dry lips and mouth.  Dizziness.  Dark urine.  Decreasing urine frequency and amount.  Confusion.  Rapid breathing or pulse. SEEK IMMEDIATE MEDICAL CARE IF:   You have blood or brown flecks (like coffee grounds) in your vomit.  You have black or bloody stools.  You have a severe headache or stiff neck.  You are confused.  You have severe abdominal pain.  You have chest pain or trouble breathing.  You do not urinate at least once every 8 hours.  You develop cold or clammy skin.  You continue to vomit for longer than 24 to 48 hours.  You have a fever. MAKE SURE YOU:   Understand these instructions.  Will watch your condition.  Will get help right away if you are not doing well or get worse.   This information is not intended to replace advice given to you by your health care provider. Make sure you discuss any questions you have with your health care provider.   Document Released: 10/10/2005 Document Revised: 01/02/2012 Document Reviewed: 03/09/2011 Elsevier Interactive Patient Education [employer]

## 2016-07-04 ENCOUNTER — Encounter: Payer: Self-pay | Admitting: Emergency Medicine

## 2016-07-04 ENCOUNTER — Emergency Department
Admission: EM | Admit: 2016-07-04 | Discharge: 2016-07-04 | Disposition: A | Discharge status: Home / Self Care | Payer: Self-pay | Attending: Student in an Organized Health Care Education/Training Program | Admitting: Student in an Organized Health Care Education/Training Program

## 2016-07-04 ENCOUNTER — Emergency Department: Payer: Self-pay

## 2016-07-04 DIAGNOSIS — N12 Tubulo-interstitial nephritis, not specified as acute or chronic: Secondary | ICD-10-CM

## 2016-07-04 DIAGNOSIS — F17210 Nicotine dependence, cigarettes, uncomplicated: Secondary | ICD-10-CM | POA: Insufficient documentation

## 2016-07-04 DIAGNOSIS — N10 Acute tubulo-interstitial nephritis: Secondary | ICD-10-CM | POA: Insufficient documentation

## 2016-07-04 DIAGNOSIS — R109 Unspecified abdominal pain: Secondary | ICD-10-CM

## 2016-07-04 HISTORY — DX: Calculus of kidney: N20.0

## 2016-07-04 HISTORY — DX: Tubulo-interstitial nephritis, not specified as acute or chronic: N12

## 2016-07-04 LAB — COMPREHENSIVE METABOLIC PANEL
ALK PHOS: 78 U/L (ref 38–126)
ALT: 14 U/L (ref 14–54)
ANION GAP: 9 (ref 5–15)
AST: 18 U/L (ref 15–41)
Albumin: 4 g/dL (ref 3.5–5.0)
BILIRUBIN TOTAL: 0.5 mg/dL (ref 0.3–1.2)
BUN: 8 mg/dL (ref 6–20)
CALCIUM: 9.1 mg/dL (ref 8.9–10.3)
CO2: 27 mmol/L (ref 22–32)
CREATININE: 0.78 mg/dL (ref 0.44–1.00)
Chloride: 99 mmol/L — ABNORMAL LOW (ref 101–111)
GFR calc non Af Amer: >60 mL/min (ref >60)
Glucose, Bld: 106 mg/dL — ABNORMAL HIGH (ref 65–99)
Potassium: 3.4 mmol/L — ABNORMAL LOW (ref 3.5–5.1)
Sodium: 135 mmol/L (ref 135–145)
TOTAL PROTEIN: 8.5 g/dL — AB (ref 6.5–8.1)

## 2016-07-04 LAB — URINALYSIS COMPLETE WITH MICROSCOPIC (ARMC ONLY)
BILIRUBIN URINE: NEGATIVE
Bacteria, UA: NONE SEEN
GLUCOSE, UA: NEGATIVE mg/dL
KETONES UR: NEGATIVE mg/dL
NITRITE: POSITIVE — AB
Protein, ur: 100 mg/dL — AB
SPECIFIC GRAVITY, URINE: 1.011 (ref 1.005–1.030)
pH: 6 (ref 5.0–8.0)

## 2016-07-04 LAB — CBC
HCT: 39.5 % (ref 35.0–47.0)
HEMOGLOBIN: 13.6 g/dL (ref 12.0–16.0)
MCH: 29.9 pg (ref 26.0–34.0)
MCHC: 34.5 g/dL (ref 32.0–36.0)
MCV: 86.5 fL (ref 80.0–100.0)
Platelets: 328 10*3/uL (ref 150–440)
RBC: 4.56 MIL/uL (ref 3.80–5.20)
RDW: 16.9 % — ABNORMAL HIGH (ref 11.5–14.5)
WBC: 10.4 10*3/uL (ref 3.6–11.0)

## 2016-07-04 LAB — POCT PREGNANCY, URINE: Preg Test, Ur: NEGATIVE

## 2016-07-04 MED ORDER — HYDROCODONE-ACETAMINOPHEN 5-325 MG PO TABS: 1.0000 | ORAL_TABLET | Freq: Four times a day (QID) | ORAL | 0 refills | Status: AC | PRN

## 2016-07-04 MED ORDER — PROMETHAZINE HCL 12.5 MG PO TABS: 12.5000 mg | ORAL_TABLET | Freq: Four times a day (QID) | ORAL | 0 refills | Status: AC | PRN

## 2016-07-04 MED ORDER — DEXTROSE 5 % IV SOLN
1.0000 g | Freq: Once | INTRAVENOUS | Status: AC
  Administered 2016-07-04: 1 g via INTRAVENOUS
  Filled 2016-07-04: qty 10

## 2016-07-04 MED ORDER — SODIUM CHLORIDE 0.9 % IV BOLUS (SEPSIS)
1000.0000 mL | Freq: Once | INTRAVENOUS | Status: AC
  Administered 2016-07-04: 1000 mL via INTRAVENOUS

## 2016-07-04 MED ORDER — KETOROLAC TROMETHAMINE 30 MG/ML IJ SOLN
15.0000 mg | Freq: Once | INTRAMUSCULAR | Status: AC
  Administered 2016-07-04: 15 mg via INTRAVENOUS
  Filled 2016-07-04: qty 1

## 2016-07-04 MED ORDER — CEPHALEXIN 500 MG PO CAPS: 500.0000 mg | ORAL_CAPSULE | Freq: Four times a day (QID) | ORAL | 0 refills | Status: AC

## 2016-07-04 NOTE — ED Provider Notes (Signed)
[facility] Emergency Department Provider Note    First MD Initiated Contact with Patient 07/04/16 [id number]     (approximate)  I have reviewed the triage vital signs and the nursing notes.   HISTORY  Chief Complaint Flank Pain    HPI Maureen Herman is a 30 y.o. female presents with 3 days of gradually worsening bilateral flank pain associated with dysuria and fevers and chills. Patient does have a history of recurrent pyelonephritis as well as history of ureterolithiasis. States his symptoms became acutely worse this morning with severe flank pain radiating to her epigastrium. Denies any vaginal discharge. No shortness of breath. Does feel dehydrated.  Denies recent antibiotic use   Past Medical History:  Diagnosis Date  . Kidney stones   . Pyelonephritis   . Thyroid disease     There are no active problems to display for this patient.   History reviewed. No pertinent surgical history.  Prior to Admission medications   Medication Sig Start Date End Date Taking? Authorizing Provider  HYDROcodone-acetaminophen (NORCO) 5-325 MG tablet Take 1 tablet by mouth every 6 (six) hours as needed for severe pain. 03/09/16   Jami L Hagler, PA-C  ondansetron (ZOFRAN ODT) 4 MG disintegrating tablet Take 1 tablet (4 mg total) by mouth every 8 (eight) hours as needed for nausea or vomiting. 03/09/16   Jami L Hagler, PA-C    Allergies Review of patient's allergies indicates no known allergies.  No family history on file.  Social History Social History  Substance Use Topics  . Smoking status: Current Every Day Smoker    Packs/day: 1.00    Types: Cigarettes  . Smokeless tobacco: Not on file  . Alcohol use Yes    Review of Systems Patient denies headaches, rhinorrhea, blurry vision, numbness, shortness of breath, chest pain, edema, cough, abdominal pain, nausea, vomiting, diarrhea, dysuria, fevers, rashes or hallucinations unless otherwise stated above in  HPI. ____________________________________________   PHYSICAL EXAM:  VITAL SIGNS: Vitals:   07/04/16 0832 07/04/16 0846  BP: 133/86   Pulse: (!) 151 (!) 126  Resp: 18   Temp: 98.7 F (37.1 C)     Constitutional: Alert and oriented. Well appearing and in no acute distress. Eyes: Conjunctivae are normal. PERRL. EOMI. Head: Atraumatic. Nose: No congestion/rhinnorhea. Mouth/Throat: Mucous membranes are moist.  Oropharynx non-erythematous. Neck: No stridor. Painless ROM. No cervical spine tenderness to palpation Hematological/Lymphatic/Immunilogical: No cervical lymphadenopathy. Cardiovascular: tachycardic, regular rhythm. Grossly normal heart sounds.  Good peripheral circulation. Respiratory: Normal respiratory effort.  No retractions. Lungs CTAB. Gastrointestinal: Soft and nontender. No distention. No abdominal bruits. No CVA tenderness. Genitourinary: Bilateral CVA ttp Musculoskeletal: No lower extremity tenderness nor edema.  No joint effusions. Neurologic:  Normal speech and language. No gross focal neurologic deficits are appreciated. No gait instability. Skin:  Skin is warm, dry and intact. No rash noted. Psychiatric: Mood and affect are normal. Speech and behavior are normal.  ____________________________________________   LABS (all labs ordered are listed, but only abnormal results are displayed)  Results for orders placed or performed during the hospital encounter of 07/04/16 (from the past 24 hour(s))  Comprehensive metabolic panel     Status: Abnormal   Collection Time: 07/04/16  8:42 AM  Result Value Ref Range   Sodium 135 135 - 145 mmol/L   Potassium 3.4 (L) 3.5 - 5.1 mmol/L   Chloride 99 (L) 101 - 111 mmol/L   CO2 27 22 - 32 mmol/L   Glucose, Bld 106 (H)  65 - 99 mg/dL   BUN 8 6 - 20 mg/dL   Creatinine, Ser 1.610.78 0.44 - 1.00 mg/dL   Calcium 9.1 8.9 - 09.610.3 mg/dL   Total Protein 8.5 (H) 6.5 - 8.1 g/dL   Albumin 4.0 3.5 - 5.0 g/dL   AST 18 15 - 41 U/L   ALT 14  14 - 54 U/L   Alkaline Phosphatase 78 38 - 126 U/L   Total Bilirubin 0.5 0.3 - 1.2 mg/dL   GFR calc non Af Amer >60 >60 mL/min   GFR calc Af Amer >60 >60 mL/min   Anion gap 9 5 - 15  CBC     Status: Abnormal   Collection Time: 07/04/16  8:42 AM  Result Value Ref Range   WBC 10.4 3.6 - 11.0 K/uL   RBC 4.56 3.80 - 5.20 MIL/uL   Hemoglobin 13.6 12.0 - 16.0 g/dL   HCT 04.539.5 40.935.0 - 81.147.0 %   MCV 86.5 80.0 - 100.0 fL   MCH 29.9 26.0 - 34.0 pg   MCHC 34.5 32.0 - 36.0 g/dL   RDW 91.416.9 (H) 78.211.5 - 95.614.5 %   Platelets 328 150 - 440 K/uL  Urinalysis complete, with microscopic ([facility] only)     Status: Abnormal   Collection Time: 07/04/16  8:42 AM  Result Value Ref Range   Color, Urine AMBER (A) YELLOW   APPearance CLOUDY (A) CLEAR   Glucose, UA NEGATIVE NEGATIVE mg/dL   Bilirubin Urine NEGATIVE NEGATIVE   Ketones, ur NEGATIVE NEGATIVE mg/dL   Specific Gravity, Urine 1.011 1.005 - 1.030   Hgb urine dipstick 2+ (A) NEGATIVE   pH 6.0 5.0 - 8.0   Protein, ur 100 (A) NEGATIVE mg/dL   Nitrite POSITIVE (A) NEGATIVE   Leukocytes, UA 3+ (A) NEGATIVE   RBC / HPF TOO NUMEROUS TO COUNT 0 - 5 RBC/hpf   WBC, UA TOO NUMEROUS TO COUNT 0 - 5 WBC/hpf   Bacteria, UA NONE SEEN NONE SEEN   Squamous Epithelial / LPF 6-30 (A) NONE SEEN   WBC Clumps PRESENT    Mucous PRESENT   Pregnancy, urine POC     Status: None   Collection Time: 07/04/16  8:50 AM  Result Value Ref Range   Preg Test, Ur NEGATIVE NEGATIVE   ____________________________________________  EKG My review and personal interpretation at Time: 8:38   Indication: tachycardia  Rate: 140  Rhythm: sinus tachycardia Axis: normal Other: non specific ST changes likely rate dependent ____________________________________________  RADIOLOGY  CT stone without evidence of hydro or stone. ____________________________________________   PROCEDURES  Procedure(s) performed: none    Critical Care performed:  no ____________________________________________   INITIAL IMPRESSION / ASSESSMENT AND PLAN / ED COURSE  Pertinent labs & imaging results that were available during my care of the patient were reviewed by me and considered in my medical decision making (see chart for details).  DDX: Stone, pyelo, pancreatitis, msk strain  Maureen Herman is a 30 y.o. who presents to the ED with Hx of stones and pyelo p/w three days bilateral flank pain. + chills and myalgias. + urinary symptoms. Denies trauma or injury. Afebrile in ED but markedly tachycardic and appears dehydrated. Exam as above. Flank TTP, otherwise abdominal exam is benign. No peritoneal signs. Possible kidney stone, cystitis, or pyelonephritis.   Checking urine. UPT negatvie UA with evidence of pyuria and hematuria CT Stone with no evidence of acute hydronephrosis or obstructing ureteral stone.  ----------------------------------------- 12:27 PM on 07/04/2016 -----------------------------------------  Clinical picture is not consistent with appendicitis, diverticulitis, pancreatitis, cholecystitis, bowel perforation, aortic dissection, splenic injury or acute abdominal process at this time.  Presentation most consistent with acute pyelonephritis. Patient with pain improved, tolerating PO. Repeat ABD exam benign.  IV fluids infused and patient is no longer tachycardic. Afebrile and hemodynamically stable.  He has received 1 dose of IV Rocephin for pyelonephritis. will plan supportive treatment and early follow up for recheck.   Clinical Course     ____________________________________________   FINAL CLINICAL IMPRESSION(S) / ED DIAGNOSES  Final diagnoses:  Right flank pain  Pyelonephritis      NEW MEDICATIONS STARTED DURING THIS VISIT:  New Prescriptions   No medications on file     Note:  This document was prepared using Dragon voice recognition software and may include unintentional dictation errors.    Willy Eddy, MD 07/04/16 1228

## 2016-07-04 NOTE — ED Notes (Signed)
Patient transported to CT 

## 2016-07-04 NOTE — ED Triage Notes (Signed)
Bilateral flank pain x 1 week, lower abdominal pain x 2 days.

## 2016-08-26 ENCOUNTER — Emergency Department
Admission: EM | Admit: 2016-08-26 | Discharge: 2016-08-26 | Disposition: A | Discharge status: Home / Self Care | Payer: Self-pay | Attending: Emergency Medicine | Admitting: Emergency Medicine

## 2016-08-26 ENCOUNTER — Encounter: Payer: Self-pay | Admitting: *Deleted

## 2016-08-26 DIAGNOSIS — J209 Acute bronchitis, unspecified: Secondary | ICD-10-CM | POA: Insufficient documentation

## 2016-08-26 DIAGNOSIS — R0981 Nasal congestion: Secondary | ICD-10-CM

## 2016-08-26 DIAGNOSIS — F17210 Nicotine dependence, cigarettes, uncomplicated: Secondary | ICD-10-CM | POA: Insufficient documentation

## 2016-08-26 DIAGNOSIS — E039 Hypothyroidism, unspecified: Secondary | ICD-10-CM | POA: Insufficient documentation

## 2016-08-26 MED ORDER — AZITHROMYCIN 250 MG PO TABS: ORAL_TABLET | ORAL | 0 refills | Status: AC

## 2016-08-26 MED ORDER — PSEUDOEPH-BROMPHEN-DM 30-2-10 MG/5ML PO SYRP: 5.0000 mL | ORAL_SOLUTION | Freq: Four times a day (QID) | ORAL | 0 refills | Status: AC | PRN

## 2016-08-26 NOTE — ED Triage Notes (Signed)
Pt complains of cough and congestion for 1 week, pt reports coughing up yellow sputum, pt denies fever

## 2016-08-26 NOTE — ED Provider Notes (Signed)
[facility] Emergency Department Provider Note   ____________________________________________   None    (approximate)  I have reviewed the triage vital signs and the nursing notes.   HISTORY  Chief Complaint Cough and Nasal Congestion     HPI Maureen Herman is a 30 y.o. female patient complaining of cough and congestion for 1 week. Basic cough is productive and yellowish in color. Patient denies any fever this complaint. Patient denies any nausea vomiting diarrhea. Patient denies pain with this complaint no palliative measures for this complaint. Patient has not taken a flu shot this season.   Past Medical History:  Diagnosis Date  . Kidney stones   . Pyelonephritis   . Thyroid disease     There are no active problems to display for this patient.   History reviewed. No pertinent surgical history.  Prior to Admission medications   Medication Sig Start Date End Date Taking? Authorizing Provider  azithromycin (ZITHROMAX Z-PAK) 250 MG tablet Take 2 tablets (500 mg) on  Day 1,  followed by 1 tablet (250 mg) once daily on Days 2 through 5. 08/26/16 08/31/16  Joni Reiningonald K Sharise Lippy, PA-C  brompheniramine-pseudoephedrine-DM 30-2-10 MG/5ML syrup Take 5 mLs by mouth 4 (four) times daily as needed. 08/26/16   Joni Reiningonald K Mikylah Ackroyd, PA-C  HYDROcodone-acetaminophen (NORCO) 5-325 MG tablet Take 1 tablet by mouth every 6 (six) hours as needed for severe pain. Patient not taking: Reported on 07/04/2016 03/09/16   Jami L Hagler, PA-C  HYDROcodone-acetaminophen (NORCO) 5-325 MG tablet Take 1 tablet by mouth every 6 (six) hours as needed for moderate pain. Pharmacist:  Do not fill without filling ABX script 07/04/16   Willy EddyPatrick Robinson, MD  ondansetron (ZOFRAN ODT) 4 MG disintegrating tablet Take 1 tablet (4 mg total) by mouth every 8 (eight) hours as needed for nausea or vomiting. Patient not taking: Reported on 07/04/2016 03/09/16   Jami L Hagler, PA-C  promethazine (PHENERGAN) 12.5 MG  tablet Take 1 tablet (12.5 mg total) by mouth every 6 (six) hours as needed for nausea or vomiting. 07/04/16   Willy EddyPatrick Robinson, MD    Allergies Review of patient's allergies indicates no known allergies.  No family history on file.  Social History Social History  Substance Use Topics  . Smoking status: Current Every Day Smoker    Packs/day: 1.00    Types: Cigarettes  . Smokeless tobacco: Never Used  . Alcohol use Yes    Review of Systems Constitutional: No fever/chills Eyes: No visual changes. ENT: No sore throat.Nasal congestion runny nose. Patient also has postnasal drainage which causes cough. Cardiovascular: Denies chest pain. Respiratory: Denies shortness of breath. Patient states productive cough Gastrointestinal: No abdominal pain.  No nausea, no vomiting.  No diarrhea.  No constipation. Genitourinary: Negative for dysuria. Musculoskeletal: Negative for back pain. Skin: Negative for rash. Neurological: Negative for headaches, focal weakness or numbness. Endocrine:Hypothyroidism ____________________________________________   PHYSICAL EXAM:  VITAL SIGNS: ED Triage Vitals [08/26/16 1512]  Enc Vitals Group     BP 118/71     Pulse Rate 97     Resp 20     Temp 98.6 F (37 C)     Temp Source Oral     SpO2 99 %     Weight 105 lb (47.6 kg)     Height 5\' 2"  (1.575 m)     Head Circumference      Peak Flow      Pain Score      Pain Loc  Pain Edu?      Excl. in GC?     Constitutional: Alert and oriented. Well appearing and in no acute distress. Eyes: Conjunctivae are normal. PERRL. EOMI. Head: Atraumatic. Nose: Bilateral maxillary guarding edematous nasal turbinates. Thick nasal discharge.  Mouth/Throat: Mucous membranes are moist.  Oropharynx non-erythematous. Neck: No stridor.  No cervical spine tenderness to palpation. Hematological/Lymphatic/Immunilogical: No cervical lymphadenopathy. Cardiovascular: Normal rate, regular rhythm. Grossly normal heart  sounds.  Good peripheral circulation. Respiratory: Normal respiratory effort.  No retractions. Lungs diffuse Rales Gastrointestinal: Soft and nontender. No distention. No abdominal bruits. No CVA tenderness. Musculoskeletal: No lower extremity tenderness nor edema.  No joint effusions. Neurologic:  Normal speech and language. No gross focal neurologic deficits are appreciated. No gait instability. Skin:  Skin is warm, dry and intact. No rash noted. Psychiatric: Mood and affect are normal. Speech and behavior are normal.  ____________________________________________   LABS (all labs ordered are listed, but only abnormal results are displayed)  Labs Reviewed - No data to display ____________________________________________  EKG   ____________________________________________  RADIOLOGY   ____________________________________________   PROCEDURES  Procedure(s) performed: None  Procedures  Critical Care performed: No  ____________________________________________   INITIAL IMPRESSION / ASSESSMENT AND PLAN / ED COURSE  Pertinent labs & imaging results that were available during my care of the patient were reviewed by me and considered in my medical decision making (see chart for details).  Bronchitis. Patient given discharge care instructions. Patient appears for Zithromax from felt DM. Patient given a work note.  Clinical Course     ____________________________________________   FINAL CLINICAL IMPRESSION(S) / ED DIAGNOSES  Final diagnoses:  Acute bronchitis, unspecified organism  Nasal congestion      NEW MEDICATIONS STARTED DURING THIS VISIT:  New Prescriptions   AZITHROMYCIN (ZITHROMAX Z-PAK) 250 MG TABLET    Take 2 tablets (500 mg) on  Day 1,  followed by 1 tablet (250 mg) once daily on Days 2 through 5.   BROMPHENIRAMINE-PSEUDOEPHEDRINE-DM 30-2-10 MG/5ML SYRUP    Take 5 mLs by mouth 4 (four) times daily as needed.     Note:  This document was prepared  using Dragon voice recognition software and may include unintentional dictation errors.    Joni Reiningonald K Jaquavious Mercer, PA-C 08/26/16 1542    Jeanmarie PlantJames A McShane, MD 08/26/16 [phone]

## 2016-11-22 ENCOUNTER — Emergency Department
Admission: EM | Admit: 2016-11-22 | Discharge: 2016-11-22 | Disposition: A | Discharge status: Home / Self Care | Payer: Self-pay | Attending: Emergency Medicine | Admitting: Emergency Medicine

## 2016-11-22 ENCOUNTER — Encounter: Payer: Self-pay | Admitting: Emergency Medicine

## 2016-11-22 ENCOUNTER — Emergency Department: Payer: Self-pay

## 2016-11-22 DIAGNOSIS — O0281 Inappropriate change in quantitative human chorionic gonadotropin (hCG) in early pregnancy: Secondary | ICD-10-CM | POA: Insufficient documentation

## 2016-11-22 DIAGNOSIS — F17210 Nicotine dependence, cigarettes, uncomplicated: Secondary | ICD-10-CM | POA: Insufficient documentation

## 2016-11-22 DIAGNOSIS — R8271 Bacteriuria: Secondary | ICD-10-CM | POA: Insufficient documentation

## 2016-11-22 DIAGNOSIS — O26891 Other specified pregnancy related conditions, first trimester: Secondary | ICD-10-CM | POA: Insufficient documentation

## 2016-11-22 DIAGNOSIS — O99331 Smoking (tobacco) complicating pregnancy, first trimester: Secondary | ICD-10-CM | POA: Insufficient documentation

## 2016-11-22 DIAGNOSIS — R109 Unspecified abdominal pain: Secondary | ICD-10-CM

## 2016-11-22 DIAGNOSIS — Z3A01 Less than 8 weeks gestation of pregnancy: Secondary | ICD-10-CM | POA: Insufficient documentation

## 2016-11-22 DIAGNOSIS — R300 Dysuria: Secondary | ICD-10-CM | POA: Insufficient documentation

## 2016-11-22 LAB — BASIC METABOLIC PANEL
ANION GAP: 6 (ref 5–15)
BUN: 11 mg/dL (ref 6–20)
CHLORIDE: 103 mmol/L (ref 101–111)
CO2: 26 mmol/L (ref 22–32)
Calcium: 9 mg/dL (ref 8.9–10.3)
Creatinine, Ser: 0.63 mg/dL (ref 0.44–1.00)
GFR calc non Af Amer: >60 mL/min (ref >60)
Glucose, Bld: 91 mg/dL (ref 65–99)
Potassium: 3.9 mmol/L (ref 3.5–5.1)
Sodium: 135 mmol/L (ref 135–145)

## 2016-11-22 LAB — CBC WITH DIFFERENTIAL/PLATELET
BASOS ABS: 0 10*3/uL (ref 0–0.1)
BASOS PCT: 0 %
Eosinophils Absolute: 0.1 10*3/uL (ref 0–0.7)
Eosinophils Relative: 2 %
HEMATOCRIT: 39.1 % (ref 35.0–47.0)
HEMOGLOBIN: 13.6 g/dL (ref 12.0–16.0)
LYMPHS PCT: 25 %
Lymphs Abs: 1.9 10*3/uL (ref 1.0–3.6)
MCH: 32.7 pg (ref 26.0–34.0)
MCHC: 34.7 g/dL (ref 32.0–36.0)
MCV: 94.3 fL (ref 80.0–100.0)
MONOS PCT: 12 %
Monocytes Absolute: 0.9 10*3/uL (ref 0.2–0.9)
NEUTROS ABS: 4.6 10*3/uL (ref 1.4–6.5)
NEUTROS PCT: 61 %
Platelets: 283 10*3/uL (ref 150–440)
RBC: 4.15 MIL/uL (ref 3.80–5.20)
RDW: 16.9 % — ABNORMAL HIGH (ref 11.5–14.5)
WBC: 7.5 10*3/uL (ref 3.6–11.0)

## 2016-11-22 LAB — URINALYSIS, COMPLETE (UACMP) WITH MICROSCOPIC
Bilirubin Urine: NEGATIVE
GLUCOSE, UA: NEGATIVE mg/dL
Hgb urine dipstick: NEGATIVE
Ketones, ur: NEGATIVE mg/dL
Leukocytes, UA: NEGATIVE
Nitrite: NEGATIVE
PROTEIN: NEGATIVE mg/dL
RBC / HPF: NONE SEEN RBC/hpf (ref 0–5)
SPECIFIC GRAVITY, URINE: 1.008 (ref 1.005–1.030)
pH: 7 (ref 5.0–8.0)

## 2016-11-22 LAB — HCG, QUANTITATIVE, PREGNANCY: hCG, Beta Chain, Quant, S: 5237 m[IU]/mL — ABNORMAL HIGH (ref <5)

## 2016-11-22 LAB — POCT PREGNANCY, URINE: Preg Test, Ur: POSITIVE — AB

## 2016-11-22 MED ORDER — NITROFURANTOIN MONOHYD MACRO 100 MG PO CAPS: 100.0000 mg | ORAL_CAPSULE | Freq: Two times a day (BID) | ORAL | 0 refills | Status: AC

## 2016-11-22 NOTE — ED Provider Notes (Signed)
[facility] Emergency Department Provider Note  ____________________________________________  Time seen: Approximately 7:44 PM  I have reviewed the triage vital signs and the nursing notes.   HISTORY  Chief Complaint Dysuria and Flank Pain    HPI Maureen Herman is a 31 y.o. female approximately [redacted] weeks pregnant presenting with intermittent right flank pain and dysuria. The patient reports that for the past several days, she has noted a discomfort in the right flank or the right lateral abdomen that lasts for several minutes and then resolve spontaneously. It is not related to position, deep breaths, or food. It is not associated with shortness of breath, vaginal discharge, or hematuria. Today she had one episode of burning with urination. No fevers or chills.   Past Medical History:  Diagnosis Date  . Kidney stones   . Pyelonephritis   . Thyroid disease     There are no active problems to display for this patient.   History reviewed. No pertinent surgical history.  Current Outpatient Rx  . Order #: [id number] Class: Print  . Order #: [id number] Class: Print  . Order #: [id number] Class: Print  . Order #: [id number] Class: Print  . Order #: [id number] Class: Print  . Order #: [id number] Class: Print    Allergies Patient has no known allergies.  No family history on file.  Social History Social History  Substance Use Topics  . Smoking status: Current Every Day Smoker    Packs/day: 1.00    Types: Cigarettes  . Smokeless tobacco: Never Used  . Alcohol use Yes    Review of Systems Constitutional: No fever/chills.No lightheadedness or syncope. Eyes: No visual changes. ENT: No sore throat. No congestion or rhinorrhea. Cardiovascular: Denies chest pain. Denies palpitations. Respiratory: Denies shortness of breath.  No cough. Gastrointestinal: Positive right flank pain. No abdominal pain.  No nausea, no vomiting.  No diarrhea.  No  constipation. Genitourinary: Positive for dysuria. No hematuria. Musculoskeletal: Negative for back pain. Skin: Negative for rash. Neurological: Negative for headaches. No focal numbness, tingling or weakness.   10-point ROS otherwise negative.  ____________________________________________   PHYSICAL EXAM:  VITAL SIGNS: ED Triage Vitals [11/22/16 1537]  Enc Vitals Group     BP 117/72     Pulse Rate 78     Resp 18     Temp 98.1 F (36.7 C)     Temp Source Oral     SpO2 100 %     Weight 114 lb (51.7 kg)     Height 5\' 2"  (1.575 m)     Head Circumference      Peak Flow      Pain Score 0     Pain Loc      Pain Edu?      Excl. in GC?     Constitutional: Alert and oriented. Well appearing and in no acute distress. Answers questions appropriately. Eyes: Conjunctivae are normal.  EOMI. No scleral icterus. Head: Atraumatic. Nose: No congestion/rhinnorhea. Mouth/Throat: Mucous membranes are moist.  Neck: No stridor.  Supple.   Cardiovascular: Normal rate, regular rhythm. No murmurs, rubs or gallops.  Respiratory: Normal respiratory effort.  No accessory muscle use or retractions. Lungs CTAB.  No wheezes, rales or ronchi. Gastrointestinal: Soft, nontender and nondistended.  No guarding or rebound.  No peritoneal signs.Mild right CVA tenderness without any overlying skin changes. Musculoskeletal: No LE edema. No ttp in the calves or palpable cords.  Negative Homan's sign. Neurologic:  A&Ox3.  Speech is clear.  Face and smile are symmetric.  EOMI.  Moves all extremities well. Skin:  Skin is warm, dry and intact. No rash noted. Psychiatric: Mood and affect are normal. Speech and behavior are normal.  Normal judgement  ____________________________________________   LABS (all labs ordered are listed, but only abnormal results are displayed)  Labs Reviewed  URINALYSIS, COMPLETE (UACMP) WITH MICROSCOPIC - Abnormal; Notable for the following:       Result Value   Color, Urine  YELLOW (*)    APPearance CLOUDY (*)    Bacteria, UA RARE (*)    Squamous Epithelial / LPF 0-5 (*)    All other components within normal limits  CBC WITH DIFFERENTIAL/PLATELET - Abnormal; Notable for the following:    RDW 16.9 (*)    All other components within normal limits  HCG, QUANTITATIVE, PREGNANCY - Abnormal; Notable for the following:    hCG, Beta Chain, Quant, S 5,237 (*)    All other components within normal limits  POCT PREGNANCY, URINE - Abnormal; Notable for the following:    Preg Test, Ur POSITIVE (*)    All other components within normal limits  BASIC METABOLIC PANEL   ____________________________________________  EKG  Not indicated ____________________________________________  RADIOLOGY  US Ob Comp Less 14 Wks  Result Date: 11/22/2016 CLINICAL DATA:  Right flank pain for several days. EXAM: OBSTETRIC <14 WK ULTRASOUND TECHNIQUE: Transabdominal ultrasound was performed for evaluation of the gestation as well as the maternal uterus and adnexal regions. COMPARISON:  None. FINDINGS: Intrauterine gestational sac: Single Yolk sac:  Visible Embryo:  Not visible Cardiac Activity: Heart Rate:  bpm MSD: 5  mm   5 w   2  d CRL:     mm    w  d                  [place] EDC: Subchorionic hemorrhage:  None visualized. Maternal uterus/adnexae: Normal ovaries. No abnormal pelvic fluid collections. IMPRESSION: Early intrauterine gestational sac with visible yolk sac, but no fetal pole or cardiac activity yet visualized. Recommend follow-up quantitative B-HCG levels and follow-up US in 14 days to assess viability. This recommendation follows SRU consensus guidelines: Diagnostic Criteria for Nonviable Pregnancy Early in the First Trimester. Malva Limes Med 2013; 629:5284-13. Electronically Signed   By: Ellery Plunk M.D.   On: 11/22/2016 19:26   US Ob Transvaginal  Result Date: 11/22/2016 CLINICAL DATA:  Right flank pain for several days. EXAM: OBSTETRIC <14 WK ULTRASOUND TECHNIQUE:  Transabdominal ultrasound was performed for evaluation of the gestation as well as the maternal uterus and adnexal regions. COMPARISON:  None. FINDINGS: Intrauterine gestational sac: Single Yolk sac:  Visible Embryo:  Not visible Cardiac Activity: Heart Rate:  bpm MSD: 5  mm   5 w   2  d CRL:     mm    w  d                  [place] EDC: Subchorionic hemorrhage:  None visualized. Maternal uterus/adnexae: Normal ovaries. No abnormal pelvic fluid collections. IMPRESSION: Early intrauterine gestational sac with visible yolk sac, but no fetal pole or cardiac activity yet visualized. Recommend follow-up quantitative B-HCG levels and follow-up US in 14 days to assess viability. This recommendation follows SRU consensus guidelines: Diagnostic Criteria for Nonviable Pregnancy Early in the First Trimester. Malva Limes Med 2013; 244:0102-72. Electronically Signed   By: Ellery Plunk M.D.   On: 11/22/2016 19:26    ____________________________________________   PROCEDURES  Procedure(s) performed: None  Procedures  Critical Care performed: No ____________________________________________   INITIAL IMPRESSION / ASSESSMENT AND PLAN / ED COURSE  Pertinent labs & imaging results that were available during my care of the patient were reviewed by me and considered in my medical decision making (see chart for details).  31 y.o. female approximately [redacted] weeks pregnant presenting with right flank pain, and now dysuria. The patient has minimal tenderness on examination, and her urine does have some skin cells. The only positive finding in her urine, which may be contaminant, is rare bacteria without any leukocyte Estrace or nitrite. However, given her pregnancy, I'll plan to treat her for UTI. Her ultrasound shows a Oakes sac which is consistent with her pregnancy history. I'll have the patient follow up with Dr. Bonney AidStaebler, the OB/GYN on call, for further evaluation and  treatment.  ____________________________________________  FINAL CLINICAL IMPRESSION(S) / ED DIAGNOSES  Final diagnoses:  Right flank pain  Bacteriuria         NEW MEDICATIONS STARTED DURING THIS VISIT:  New Prescriptions   NITROFURANTOIN, MACROCRYSTAL-MONOHYDRATE, (MACROBID) 100 MG CAPSULE    Take 1 capsule (100 mg total) by mouth 2 (two) times daily.      Rockne MenghiniAnne-Caroline Rhapsody Wolven, MD 11/22/16 1947

## 2016-11-22 NOTE — Discharge Instructions (Signed)
Please take the entire course of antibiotics, even if you're feeling better. You may take Tylenol for pain, but avoid NSAID medications when you're pregnant.  Return to the emergency department if you develop severe pain, vomiting, fever, vaginal bleeding, or any other symptoms concerning to you.

## 2016-11-22 NOTE — ED Triage Notes (Signed)
Patient presents to the ED with right flank pain x 2 days with dysuria.  Patient reports that she is approximately [redacted] weeks pregnant.  Denies abdominal pain and denies vaginal bleeding.  Patient is in no obvious distress at this time.

## 2016-12-10 ENCOUNTER — Encounter: Payer: Self-pay | Admitting: Emergency Medicine

## 2016-12-10 ENCOUNTER — Emergency Department
Admission: EM | Admit: 2016-12-10 | Discharge: 2016-12-10 | Disposition: A | Discharge status: Home / Self Care | Payer: Self-pay | Attending: Emergency Medicine | Admitting: Emergency Medicine

## 2016-12-10 DIAGNOSIS — O99331 Smoking (tobacco) complicating pregnancy, first trimester: Secondary | ICD-10-CM | POA: Insufficient documentation

## 2016-12-10 DIAGNOSIS — Z3A08 8 weeks gestation of pregnancy: Secondary | ICD-10-CM | POA: Insufficient documentation

## 2016-12-10 DIAGNOSIS — Z5321 Procedure and treatment not carried out due to patient leaving prior to being seen by health care provider: Secondary | ICD-10-CM | POA: Insufficient documentation

## 2016-12-10 DIAGNOSIS — O9989 Other specified diseases and conditions complicating pregnancy, childbirth and the puerperium: Secondary | ICD-10-CM | POA: Insufficient documentation

## 2016-12-10 DIAGNOSIS — G43909 Migraine, unspecified, not intractable, without status migrainosus: Secondary | ICD-10-CM | POA: Insufficient documentation

## 2016-12-10 DIAGNOSIS — F17210 Nicotine dependence, cigarettes, uncomplicated: Secondary | ICD-10-CM | POA: Insufficient documentation

## 2016-12-10 NOTE — ED Triage Notes (Signed)
Per tech patient no longer in chairs waiting for triage when he went to start her protocols. Called for patient in main waiting room and no answer.

## 2016-12-10 NOTE — ED Triage Notes (Signed)
States lower R abdominal pain x 2 days. Denies bleeding or fluid. States also migraine x 2 days.

## 2016-12-11 ENCOUNTER — Encounter: Payer: Self-pay | Admitting: Emergency Medicine

## 2016-12-11 ENCOUNTER — Emergency Department: Payer: Self-pay

## 2016-12-11 ENCOUNTER — Emergency Department
Admission: EM | Admit: 2016-12-11 | Discharge: 2016-12-11 | Disposition: A | Discharge status: Home / Self Care | Payer: Self-pay | Attending: Emergency Medicine | Admitting: Emergency Medicine

## 2016-12-11 DIAGNOSIS — O210 Mild hyperemesis gravidarum: Secondary | ICD-10-CM

## 2016-12-11 DIAGNOSIS — O219 Vomiting of pregnancy, unspecified: Secondary | ICD-10-CM | POA: Insufficient documentation

## 2016-12-11 DIAGNOSIS — R1031 Right lower quadrant pain: Secondary | ICD-10-CM

## 2016-12-11 DIAGNOSIS — O99331 Smoking (tobacco) complicating pregnancy, first trimester: Secondary | ICD-10-CM | POA: Insufficient documentation

## 2016-12-11 DIAGNOSIS — Z3A01 Less than 8 weeks gestation of pregnancy: Secondary | ICD-10-CM | POA: Insufficient documentation

## 2016-12-11 DIAGNOSIS — Z3491 Encounter for supervision of normal pregnancy, unspecified, first trimester: Secondary | ICD-10-CM

## 2016-12-11 DIAGNOSIS — R102 Pelvic and perineal pain: Secondary | ICD-10-CM | POA: Insufficient documentation

## 2016-12-11 DIAGNOSIS — F17210 Nicotine dependence, cigarettes, uncomplicated: Secondary | ICD-10-CM | POA: Insufficient documentation

## 2016-12-11 LAB — URINALYSIS, COMPLETE (UACMP) WITH MICROSCOPIC
BACTERIA UA: NONE SEEN
BILIRUBIN URINE: NEGATIVE
Glucose, UA: NEGATIVE mg/dL
HGB URINE DIPSTICK: NEGATIVE
KETONES UR: NEGATIVE mg/dL
Nitrite: NEGATIVE
PROTEIN: NEGATIVE mg/dL
SPECIFIC GRAVITY, URINE: 1.016 (ref 1.005–1.030)
pH: 6 (ref 5.0–8.0)

## 2016-12-11 LAB — HCG, QUANTITATIVE, PREGNANCY: hCG, Beta Chain, Quant, S: 158291 m[IU]/mL — ABNORMAL HIGH (ref <5)

## 2016-12-11 LAB — POCT PREGNANCY, URINE: PREG TEST UR: POSITIVE — AB

## 2016-12-11 MED ORDER — METOCLOPRAMIDE HCL 5 MG/ML IJ SOLN
10.0000 mg | Freq: Once | INTRAMUSCULAR | Status: AC
  Administered 2016-12-11: 10 mg via INTRAVENOUS
  Filled 2016-12-11: qty 2

## 2016-12-11 MED ORDER — SODIUM CHLORIDE 0.9 % IV BOLUS (SEPSIS)
1000.0000 mL | Freq: Once | INTRAVENOUS | Status: AC
  Administered 2016-12-11: 1000 mL via INTRAVENOUS

## 2016-12-11 MED ORDER — ACETAMINOPHEN 325 MG PO TABS
650.0000 mg | ORAL_TABLET | Freq: Once | ORAL | Status: AC
  Administered 2016-12-11: 650 mg via ORAL
  Filled 2016-12-11: qty 2

## 2016-12-11 NOTE — ED Triage Notes (Signed)
Patient presents to the ED with right sided pelvic pain since Friday.  Patient describes pain as, "stabbing pain".  Patient is also complaining of headache.  Patient came to the ED yesterday for the same but left prior to seeing a provider.  Patient reports being [redacted] weeks pregnant.  Patient is in no obvious distress.  Denies vaginal bleeding.

## 2016-12-11 NOTE — ED Provider Notes (Signed)
[facility] Emergency Department Provider Note   ____________________________________________   First MD Initiated Contact with Patient 12/11/16 1640     (approximate)  I have reviewed the triage vital signs and the nursing notes.   HISTORY  Chief Complaint Pelvic Pain    HPI Maureen Herman is a 31 y.o. female porches about a week's period. She has been experiencing nausea, oftentimes not eating well, began having a light headache off and on for the last few days. She reportedly gets better with Tylenol, and she is also experiencing some slight discomfort in the lower abdomen. She notified triage, the pain is primarily right-sided, but when I speak to her she reports a slight discomfort that is suprapubically. Denies any pain or radiation of the back. No vaginal discharge or bleeding.  Patient reports she thinks she might be getting slightly dehydrated, some nausea.  No fevers. No vomiting. No diarrhea.   Past Medical History:  Diagnosis Date  . Kidney stones   . Pyelonephritis   . Thyroid disease     There are no active problems to display for this patient.   History reviewed. No pertinent surgical history.  Currently reports not taking any medications other than occasional Tylenol.  Allergies Patient has no known allergies.  No family history on file.  Social History Social History  Substance Use Topics  . Smoking status: Current Every Day Smoker    Packs/day: 0.10    Types: Cigarettes  . Smokeless tobacco: Never Used  . Alcohol use Yes    Review of Systems Constitutional: No fever/chills Eyes: No visual changes. ENT: No sore throat. Cardiovascular: Denies chest pain. Respiratory: Denies shortness of breath. Gastrointestinal: No abdominal pain but describes a slight "discomfort" across the pelvis for several days.  No vomiting.  No diarrhea.  No constipation. Genitourinary: Negative for dysuria. Musculoskeletal: Negative for  back pain. Skin: Negative for rash. Neurological: Negative for headaches, focal weakness or numbness.  10-point ROS otherwise negative.  ____________________________________________   PHYSICAL EXAM:  VITAL SIGNS: ED Triage Vitals [12/11/16 1351]  Enc Vitals Group     BP 104/65     Pulse Rate 72     Resp 18     Temp 99.1 F (37.3 C)     Temp Source Oral     SpO2 100 %     Weight 119 lb (54 kg)     Height 5\' 2"  (1.575 m)     Head Circumference      Peak Flow      Pain Score 8     Pain Loc      Pain Edu?      Excl. in GC?     Constitutional: Alert and oriented. Well appearing and in no acute distress. Eyes: Conjunctivae are normal. PERRL. EOMI. Head: Atraumatic. Nose: No congestion/rhinnorhea. Mouth/Throat: Mucous membranes are moist.  Oropharynx non-erythematous. Neck: No stridor.   Cardiovascular: Normal rate, regular rhythm. Grossly normal heart sounds.  Good peripheral circulation. Respiratory: Normal respiratory effort.  No retractions. Lungs CTAB. Gastrointestinal: Soft and nontender. No distention. Not obviously gravid. No suprapubic tenderness. No pain at McBurney's point. Negative Rovsing. Negative psoas. Negative Murphy No rebound, guarding, or peritonitis. Musculoskeletal: No lower extremity tenderness nor edema.   Neurologic:  Normal speech and language. No gross focal neurologic deficits are appreciated.  Skin:  Skin is warm, dry and intact. No rash noted. Psychiatric: Mood and affect are normal. Speech and behavior are normal.  ____________________________________________   LABS (  all labs ordered are listed, but only abnormal results are displayed)  Labs Reviewed  HCG, QUANTITATIVE, PREGNANCY - Abnormal; Notable for the following:       Result Value   hCG, Beta Chain, Quant, S 158,291 (*)    All other components within normal limits  URINALYSIS, COMPLETE (UACMP) WITH MICROSCOPIC - Abnormal; Notable for the following:    Color, Urine YELLOW (*)     APPearance CLOUDY (*)    Leukocytes, UA SMALL (*)    Squamous Epithelial / LPF 6-30 (*)    All other components within normal limits  POCT PREGNANCY, URINE - Abnormal; Notable for the following:    Preg Test, Ur POSITIVE (*)    All other components within normal limits  WET PREP, GENITAL  CHLAMYDIA/NGC RT PCR ([facility] ONLY)  POC URINE PREG, ED   ____________________________________________  EKG   ____________________________________________  RADIOLOGY  US Ob Comp Less 14 Wks  Result Date: 12/11/2016 CLINICAL DATA:  Pregnant patient with right lower quadrant abdominal pain for 2 days. EXAM: OBSTETRIC <14 WK [place] AND TRANSVAGINAL OB US TECHNIQUE: Both transabdominal and transvaginal ultrasound examinations were performed for complete evaluation of the gestation as well as the maternal uterus, adnexal regions, and pelvic cul-de-sac. Transvaginal technique was performed to assess early pregnancy. COMPARISON:  Pelvic ultrasound 11/22/2016 FINDINGS: Intrauterine gestational sac: Single Yolk sac:  Visualized. Embryo:  Visualized. Cardiac Activity: Visualized. Heart Rate: 158  bpm CRL:  14.8  mm   7 w   6 d                  [place] EDC: 07/24/2017 Subchorionic hemorrhage:  Small Maternal uterus/adnexae: Probable corpus luteum within the left ovary. Right ovary is normal. Trace free fluid in the pelvis. IMPRESSION: Single live intrauterine gestation.  Small subchorionic hemorrhage. Electronically Signed   By: Annia Belt M.D.   On: 12/11/2016 17:58   US Ob Transvaginal  Result Date: 12/11/2016 CLINICAL DATA:  Pregnant patient with right lower quadrant abdominal pain for 2 days. EXAM: OBSTETRIC <14 WK [place] AND TRANSVAGINAL OB US TECHNIQUE: Both transabdominal and transvaginal ultrasound examinations were performed for complete evaluation of the gestation as well as the maternal uterus, adnexal regions, and pelvic cul-de-sac. Transvaginal technique was performed to assess early pregnancy. COMPARISON:   Pelvic ultrasound 11/22/2016 FINDINGS: Intrauterine gestational sac: Single Yolk sac:  Visualized. Embryo:  Visualized. Cardiac Activity: Visualized. Heart Rate: 158  bpm CRL:  14.8  mm   7 w   6 d                  [place] EDC: 07/24/2017 Subchorionic hemorrhage:  Small Maternal uterus/adnexae: Probable corpus luteum within the left ovary. Right ovary is normal. Trace free fluid in the pelvis. IMPRESSION: Single live intrauterine gestation.  Small subchorionic hemorrhage. Electronically Signed   By: Annia Belt M.D.   On: 12/11/2016 17:58    ____________________________________________   PROCEDURES  Procedure(s) performed: None  Procedures  Critical Care performed: No  ____________________________________________   INITIAL IMPRESSION / ASSESSMENT AND PLAN / ED COURSE  Pertinent labs & imaging results that were available during my care of the patient were reviewed by me and considered in my medical decision making (see chart for details).  Patient presents for lower abdominal discomfort. She occasionally describes a sharp feeling in the lower abdomen, which comes and goes. Also reports feeling slightly nauseated" dehydrated".   Reassuring clinical exam. Lab work, urinalysis reassuring with hCG appropriate. Ultrasound demonstrating positive intrauterine  pregnancy. Dates appropriate, patient estimates she is about 8 weeks.  Intended to performed pelvic exam, but patient notified that she needed to leave the hospital as she needed to go pick up her child. Discussed with her careful return precautions, she'll return right away if she has increasing pain, focal pain in the right lower abdomen, vomiting, fever, and plans to follow-up with the health department. Of note, she does not complain infectious symptoms. Denies any vaginal discharge or fever.   The patient reports she feels much better after receiving nausea medicine and IV fluids. I discussed and recommended she receive a prescription for  nausea medicine, but she reports that she feels much better and does not wish to take any medications for it at this time. Think this is reasonable, she reports improvement. He is comfortable and on repeat exam abdomen is soft nontender no discomfort throughout.  Patient appears stable for discharge, no evidence of acute emergent condition identified. She'll follow-up with [place] health Department for further workup and follow-up at this point.  Return precautions and treatment recommendations and follow-up discussed with the patient who is agreeable with the plan.       ____________________________________________   FINAL CLINICAL IMPRESSION(S) / ED DIAGNOSES  Final diagnoses:  First trimester pregnancy  Morning sickness      NEW MEDICATIONS STARTED DURING THIS VISIT:  New Prescriptions   No medications on file     Note:  This document was prepared using Dragon voice recognition software and may include unintentional dictation errors.     Sharyn CreamerMark Elainah Rhyne, MD 12/11/16 [phone]

## 2016-12-11 NOTE — Discharge Instructions (Signed)
Please follow up closely with the health department next few days.  Return to the emergency room if you develop fever, vaginal discharge, vaginal bleeding, weakness, frequent vomiting, chest pain, trouble breathing persistent abdominal pain or other new concerns or symptoms arise

## 2016-12-12 ENCOUNTER — Other Ambulatory Visit: Payer: Self-pay | Admitting: Advanced Practice Midwife

## 2016-12-12 DIAGNOSIS — Z369 Encounter for antenatal screening, unspecified: Secondary | ICD-10-CM

## 2016-12-26 ENCOUNTER — Ambulatory Visit: Payer: No Typology Code available for payment source

## 2017-01-05 ENCOUNTER — Ambulatory Visit
Admission: RE | Admit: 2017-01-05 | Discharge: 2017-01-05 | Disposition: A | Discharge status: Home / Self Care | Payer: Self-pay | Source: Ambulatory Visit | Attending: Obstetrics & Gynecology | Admitting: Obstetrics & Gynecology

## 2017-01-05 ENCOUNTER — Other Ambulatory Visit: Payer: Self-pay | Admitting: *Deleted

## 2017-01-05 ENCOUNTER — Ambulatory Visit
Admission: RE | Admit: 2017-01-05 | Discharge: 2017-01-05 | Disposition: A | Discharge status: Home / Self Care | Payer: Self-pay | Source: Ambulatory Visit | Attending: Advanced Practice Midwife | Admitting: Advanced Practice Midwife

## 2017-01-05 ENCOUNTER — Encounter: Payer: Self-pay | Admitting: *Deleted

## 2017-01-05 VITALS — BP 130/73 | HR 98 | Temp 98.7°F | Resp 18 | Ht 62.4 in | Wt 123.6 lb

## 2017-01-05 DIAGNOSIS — Z369 Encounter for antenatal screening, unspecified: Secondary | ICD-10-CM

## 2017-01-05 DIAGNOSIS — Z3A11 11 weeks gestation of pregnancy: Secondary | ICD-10-CM | POA: Insufficient documentation

## 2017-01-05 DIAGNOSIS — N83292 Other ovarian cyst, left side: Secondary | ICD-10-CM | POA: Insufficient documentation

## 2017-01-05 DIAGNOSIS — O99311 Alcohol use complicating pregnancy, first trimester: Secondary | ICD-10-CM

## 2017-01-05 DIAGNOSIS — O3481 Maternal care for other abnormalities of pelvic organs, first trimester: Secondary | ICD-10-CM | POA: Insufficient documentation

## 2017-01-05 DIAGNOSIS — F1010 Alcohol abuse, uncomplicated: Secondary | ICD-10-CM

## 2017-01-05 DIAGNOSIS — O0990 Supervision of high risk pregnancy, unspecified, unspecified trimester: Secondary | ICD-10-CM

## 2017-01-05 HISTORY — DX: Laceration of liver, unspecified degree, initial encounter: S36.113A

## 2017-01-05 NOTE — Progress Notes (Addendum)
Maureen Herman Length of Consultation: 45 minutes   Maureen Herman  was referred to [facility] for genetic counseling to review prenatal screening and testing options.  This note summarizes the information we discussed.    We offered the following routine screening tests for this pregnancy:  First trimester screening, which includes nuchal translucency ultrasound screen and first trimester maternal serum marker screening.  The nuchal translucency has approximately an 80% detection rate for Down syndrome and can be positive for other chromosome abnormalities as well as congenital heart defects.  When combined with a maternal serum marker screening, the detection rate is up to 90% for Down syndrome and up to 97% for trisomy 18.     Maternal serum marker screening, a blood test that measures pregnancy proteins, can provide risk assessments for Down syndrome, trisomy 18, and open neural tube defects (spina bifida, anencephaly). Because it does not directly examine the fetus, it cannot positively diagnose or rule out these problems.  Targeted ultrasound uses high frequency sound waves to create an image of the developing fetus.  An ultrasound is often recommended as a routine means of evaluating the pregnancy.  It is also used to screen for fetal anatomy problems (for example, a heart defect) that might be suggestive of a chromosomal or other abnormality.   Should these screening tests indicate an increased concern, then the following additional testing options would be offered:  The chorionic villus sampling procedure is available for first trimester chromosome analysis.  This involves the withdrawal of a small amount of chorionic villi (tissue from the developing placenta).  Risk of pregnancy loss is estimated to be approximately 1 in 200 to 1 in 100 (0.5 to 1%).  There is approximately a 1% (1 in 100) chance that the CVS chromosome results will be unclear.  Chorionic  villi cannot be tested for neural tube defects.     Amniocentesis involves the removal of a small amount of amniotic fluid from the sac surrounding the fetus with the use of a thin needle inserted through the maternal abdomen and uterus.  Ultrasound guidance is used throughout the procedure.  Fetal cells from amniotic fluid are directly evaluated and > 99.5% of chromosome problems and > 98% of open neural tube defects can be detected. This procedure is generally performed after the 15th week of pregnancy.  The main risks to this procedure include complications leading to miscarriage in less than 1 in 200 cases (0.5%).  As another option for information if the pregnancy is suspected to be an an increased chance for certain chromosome conditions, we also reviewed the availability of cell free fetal DNA testing from maternal blood to determine whether or not the baby may have either Down syndrome, trisomy 27, or trisomy 64.  This test utilizes a maternal blood sample and DNA sequencing technology to isolate circulating cell free fetal DNA from maternal plasma.  The fetal DNA can then be analyzed for DNA sequences that are derived from the three most common chromosomes involved in aneuploidy, chromosomes 13, 18, and 21.  If the overall amount of DNA is greater than the expected level for any of these chromosomes, aneuploidy is suspected.  While we do not consider it a replacement for invasive testing and karyotype analysis, a negative result from this testing would be reassuring, though not a guarantee of a normal chromosome complement for the baby.  An abnormal result is certainly suggestive of an abnormal chromosome complement, though we would still  recommend CVS or amniocentesis to confirm any findings from this testing.  Cystic Fibrosis and Spinal Muscular Atrophy (SMA) screening were also discussed with the patient. Both conditions are recessive, which means that both parents must be carriers in order to have  a child with the disease.  Cystic fibrosis (CF) is one of the most common genetic conditions in persons of Caucasian ancestry.  This condition occurs in approximately 1 in 2,500 Caucasian persons and results in thickened secretions in the lungs, digestive, and reproductive systems.  For a baby to be at risk for having CF, both of the parents must be carriers for this condition.  Approximately 1 in 10525 Caucasian persons is a carrier for CF.  Current carrier testing looks for the most common mutations in the gene for CF and can detect approximately 90% of carriers in the Caucasian population.  This means that the carrier screening can greatly reduce, but cannot eliminate, the chance for an individual to have a child with CF.  If an individual is found to be a carrier for CF, then carrier testing would be available for the partner. As part of [place]'s newborn screening profile, all babies born in the state of [place] will have a two-tier screening process.  Specimens are first tested to determine the concentration of immunoreactive trypsinogen (IRT).  The top 5% of specimens with the highest IRT values then undergo DNA testing using a panel of over 40 common CF mutations. SMA is a neurodegenerative disorder that leads to atrophy of skeletal muscle and overall weakness.  This condition is also more prevalent in the Caucasian population, with 1 in 40-1 in 60 persons being a carrier and 1 in 6,000-1 in 10,000 children being affected.  There are multiple forms of the disease, with some causing death in infancy to other forms with survival into adulthood.  The genetics of SMA is complex, but carrier screening can detect up to 95% of carriers in the Caucasian population.  Similar to CF, a negative result can greatly reduce, but cannot eliminate, the chance to have a child with SMA.  We obtained a detailed family history and pregnancy history.  Maureen Herman reported that her mother had incompetent cervix and  vitamin K deficiency in pregnancy and lost several pregnancies.  The patient has not had these concerns in her last two pregnancies, and there have been no other family members with incompetent cervix or known connective tissue disorders.  She did report a history of hypothyroidism and she is not taking medication for this currently.  However, the [facility] checked her levels, which are reported to be normal.  If an MFM consultation is desired, that can be scheduled at a future date.  The remainder of the family history was reported to be unremarkable for birth defects, intellectual delays, recurrent pregnancy loss or known chromosome abnormalities.  Maureen Herman stated that this is her fourth pregnancy.  She has a 32 year old son from a prior relationship who is in good health.  She also had one elective termination.  She and her current partner have a 863 year old daughter who is also in good health.  She reported drinking multiple drinks five nights per week until she found out that she was pregnant.  She stated that she found out that she was pregnant in late January, which would be right around 4-[redacted] weeks gestation, prior to the ultrasound she had on 11/22/16 at 5 weeks, 2 days.   Alcohol consumption during pregnancy  has been associated with a number of birth defects including growth delays, small head size, heart defects, eye anomalies and facial differences as well as learning disabilities and behavioral problems.  The risk of these to occur tends to increase with the amount of alcohol consumed, however, malformations have been seen with as little as two drinks per day.  Because there is no safe amount of alcohol consumption during pregnancy, we suggest women completely avoid alcohol while pregnant.  Based upon the patient's report, it appears that her alcohol use was during the all or none period, which would greatly reduce the chance for effects on the fetus.  We scheduled a level 2 ultrasound at [redacted] weeks gestation,  which may help to detect growth delays or birth defects associated with alcohol use.  However, it is important to remember that not all birth defects can be identified prenatally.  If there is any additional exposure or if views of the heart are suboptimal, we would also consider a fetal echocardiogram after [redacted] weeks gestation.  She also reported smoking about 2 cigarettes per day, down from 1 pack per day prior to learning that she was pregnant.  As we discussed, smoking during pregnancy has been associated with low birth weight, premature delivery and pregnancy loss.  For this reason, we suggest that she continue to cut back or avoid smoking for the remainder of the pregnancy.  After consideration of the options, Maureen Herman elected to proceed with first trimester screening.  She declined CF and SMA carrier screening.  An ultrasound was performed at the time of the visit.  The gestational age was consistent with  11 weeks.  Fetal anatomy could not be assessed due to early gestational age.  Please refer to the ultrasound report for details of that study.  Maureen Herman was encouraged to call with questions or concerns.  We can be contacted at [phone].    Cherly Anderson, MS, CGC  Pharrell Ledford, [place] A, MD

## 2017-01-09 ENCOUNTER — Telehealth: Payer: Self-pay | Admitting: Obstetrics and Gynecology

## 2017-01-09 NOTE — Telephone Encounter (Signed)
The results of the First Trimester Nuchal Translucency and Biochemical Screening are now available.  These results showed an increased risk for Down Syndrome.  Specifically, the risk for Down syndrome is now 1 in 33191 (the prior risk based upon age alone was 1 in 37518).  The risk for Trisomy 13/18 is estimated to be 1 in >10,000.  As we discussed when we called with these results, if more definitive information is desired, we would offer chorionic villus sampling or amniocentesis.  Because we do not yet know the effectiveness of combined first and second trimester screening, we do not recommend a maternal serum screen to assess the chance for chromosome conditions.  However, if screening for neural tube defects is desired, maternal serum screening for AFP only can be performed between 15 and [redacted] weeks gestation.    We also reviewed the availability of cell free fetal DNA testing from maternal blood to determine whether or not the baby may have either Down syndrome, trisomy 1213, or trisomy 2418.  This test utilizes a maternal blood sample and DNA sequencing technology to isolate circulating cell free fetal DNA from maternal plasma.  The fetal DNA can then be analyzed for DNA sequences that are derived from the three most common chromosomes involved in aneuploidy, chromosomes 13, 18, and 21.  If the overall amount of DNA is greater than the expected level for any of these chromosomes, aneuploidy is suspected.  While we do not consider it a replacement for invasive testing and karyotype analysis, a negative result from this testing would be reassuring, though not a guarantee of a normal chromosome complement for the baby.  An abnormal result is certainly suggestive of an abnormal chromosome complement, though we would still recommend CVS or amniocentesis to confirm any findings from this testing  The patient elected to return to clinic for additional genetic counseling and cell free DNA testing on Thursday, 01/12/2017  at 9am.  The patient was encouraged to contact us with any questions.  We may be reached at [phone].  Cherly Andersoneborah F. Yanelle Sousa, MS, CGC

## 2017-01-12 ENCOUNTER — Ambulatory Visit
Admission: RE | Admit: 2017-01-12 | Discharge: 2017-01-12 | Disposition: A | Discharge status: Home / Self Care | Payer: No Typology Code available for payment source | Source: Ambulatory Visit | Attending: Maternal and Fetal Medicine | Admitting: Maternal and Fetal Medicine

## 2017-01-12 ENCOUNTER — Other Ambulatory Visit: Payer: Self-pay

## 2017-01-12 VITALS — BP 116/62 | Temp 98.2°F | Resp 18 | Wt 124.0 lb

## 2017-01-12 DIAGNOSIS — O289 Unspecified abnormal findings on antenatal screening of mother: Secondary | ICD-10-CM

## 2017-01-12 NOTE — Progress Notes (Addendum)
Ms. Delton Seeelson was seen for brief genetic counseling and blood draw today.  We discussed the positive results from her first trimester screening, which gave a 1 in 191 chance for Down syndrome.  We reviewed that option of cell free DNA testing, CVS, amniocentesis and ultrasound.  She is already scheduled for a detailed anatomy [facility] in the second trimester.  She desires cell free DNA testing today and declines invasive testing at this time due to the risks of the procedures.  We will contact her when results are available, usually in 1 week.  Cherly Andersoneborah F. Wells, MS, CGC  I was immediately available and supervising. Camelia PhenesBrenna L Jonanthan Bolender, MD [facility]

## 2017-01-18 LAB — INFORMASEQ(SM) WITH XY ANALYSIS
FETAL NUMBER: 1
Fetal Fraction (%):: 7.5
Gestational Age at Collection: 12.7 weeks
Weight: 124 [lb_av]

## 2017-01-19 ENCOUNTER — Telehealth: Payer: Self-pay | Admitting: Obstetrics and Gynecology

## 2017-01-19 NOTE — Telephone Encounter (Signed)
Following an increased risk for Down syndrome on routine first trimester screening, Ms. Maureen Herman elected to have cell free DNA testing performed on 01/12/2017.  Today, the patient was informed of the results of her recent InformaSeq testing (performed at [facility]) which yielded NEGATIVE results.  The patient's specimen showed DNA consistent with two copies of chromosomes 21, 18 and 13.  The sensitivity for trisomy 6021, trisomy 7118 and trisomy 8313 using this testing are reported as 99.1%, 98.3% and 98.1% respectively.  Thus, while the results of this testing are highly accurate, they are not considered diagnostic at this time.  Should more definitive information be desired, the patient may still consider amniocentesis.   As requested to know by the patient, sex chromosome analysis was included for this sample.  Results was consistent with a female fetus (XY). This is predicted with >97% accuracy.  A maternal serum AFP only should be considered if screening for neural tube defects is desired.  Maureen Andersoneborah F. Zaryia Markel, MS, CGC

## 2017-02-27 ENCOUNTER — Other Ambulatory Visit: Payer: Self-pay | Admitting: *Deleted

## 2017-02-27 DIAGNOSIS — Z3689 Encounter for other specified antenatal screening: Secondary | ICD-10-CM

## 2017-03-01 ENCOUNTER — Emergency Department
Admission: EM | Admit: 2017-03-01 | Discharge: 2017-03-01 | Disposition: A | Discharge status: Home / Self Care | Payer: Medicaid Other | Attending: Emergency Medicine | Admitting: Emergency Medicine

## 2017-03-01 ENCOUNTER — Encounter: Payer: Self-pay | Admitting: Emergency Medicine

## 2017-03-01 DIAGNOSIS — E039 Hypothyroidism, unspecified: Secondary | ICD-10-CM | POA: Insufficient documentation

## 2017-03-01 DIAGNOSIS — Z3A19 19 weeks gestation of pregnancy: Secondary | ICD-10-CM | POA: Insufficient documentation

## 2017-03-01 DIAGNOSIS — Z79899 Other long term (current) drug therapy: Secondary | ICD-10-CM | POA: Insufficient documentation

## 2017-03-01 DIAGNOSIS — M549 Dorsalgia, unspecified: Secondary | ICD-10-CM

## 2017-03-01 DIAGNOSIS — F17210 Nicotine dependence, cigarettes, uncomplicated: Secondary | ICD-10-CM | POA: Diagnosis not present

## 2017-03-01 DIAGNOSIS — O26892 Other specified pregnancy related conditions, second trimester: Secondary | ICD-10-CM | POA: Diagnosis not present

## 2017-03-01 DIAGNOSIS — O99332 Smoking (tobacco) complicating pregnancy, second trimester: Secondary | ICD-10-CM | POA: Diagnosis not present

## 2017-03-01 DIAGNOSIS — O9989 Other specified diseases and conditions complicating pregnancy, childbirth and the puerperium: Secondary | ICD-10-CM

## 2017-03-01 DIAGNOSIS — M545 Low back pain: Secondary | ICD-10-CM | POA: Diagnosis not present

## 2017-03-01 NOTE — ED Triage Notes (Signed)
Pt to ed with c/o lower back pain worse on left side.  Pt states has been progressively getting worse throughout her pregnancy.  States pain radiates into left knee.  Pt states worse with standing.

## 2017-03-01 NOTE — ED Provider Notes (Signed)
[facility] Emergency Department Provider Note   ____________________________________________   First MD Initiated Contact with Patient 03/01/17 1359     (approximate)  I have reviewed the triage vital signs and the nursing notes.   HISTORY  Chief Complaint Back Pain    HPI Maureen Herman is a 31 y.o. female patient complaining of low back pain which is worse in the right side. Patient state pain has progressively worsened throughout her pregnancy. Patient is now [redacted] weeks gestation. Patient stated pain radiates to the left knee. Patient stated pain increases with standing. Patient work requires prolonged standing as a way chest. Patient has a history of a ruptured disc and 2006. Patient states she had 2 pregnancies since the rupture and is tolerating well until this pregnancy. Patient state taken Tylenol without relief.Patient denies bladder or bowel dysfunction.   Past Medical History:  Diagnosis Date  . Kidney stones   . Liver laceration   . Pyelonephritis   . Thyroid disease     Patient Active Problem List   Diagnosis Date Noted  . Abnormal first trimester screen   . 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 First trimester screening     History reviewed. No pertinent surgical history.  Prior to Admission medications   Medication Sig Start Date End Date Taking? Authorizing Provider  brompheniramine-pseudoephedrine-DM 30-2-10 MG/5ML syrup Take 5 mLs by mouth 4 (four) times daily as needed. Patient not taking: Reported on 01/05/2017 08/26/16   Joni Reining, PA-C  HYDROcodone-acetaminophen [facility]) 5-325 MG tablet Take 1 tablet by mouth every 6 (six) hours as needed for severe pain. Patient not taking: Reported on 07/04/2016 03/09/16   Hagler, Jami L, PA-C  HYDROcodone-acetaminophen (NORCO) 5-325 MG tablet Take 1 tablet by mouth every 6 (six) hours as needed for moderate pain. Pharmacist:  Do not fill without filling ABX  script Patient not taking: Reported on 01/05/2017 07/04/16   Willy Eddy, MD  ondansetron (ZOFRAN ODT) 4 MG disintegrating tablet Take 1 tablet (4 mg total) by mouth every 8 (eight) hours as needed for nausea or vomiting. Patient not taking: Reported on 07/04/2016 03/09/16   Hagler, Jami L, PA-C  Prenatal Vit-Fe Fumarate-FA (MULTIVITAMIN-PRENATAL) 27-0.8 MG TABS tablet Take 1 tablet by mouth daily at 12 noon.    [provider]  promethazine (PHENERGAN) 12.5 MG tablet Take 1 tablet (12.5 mg total) by mouth every 6 (six) hours as needed for nausea or vomiting. Patient not taking: Reported on 01/05/2017 07/04/16   Willy Eddy, MD    Allergies Patient has no known allergies.  History reviewed. No pertinent family history.  Social History Social History  Substance Use Topics  . Smoking status: Current Every Day Smoker    Packs/day: 0.10    Types: Cigarettes  . Smokeless tobacco: Never Used  . Alcohol use Yes     Comment: not during pregnancy    Review of Systems  Constitutional: No fever/chills Eyes: No visual changes. ENT: No sore throat. Cardiovascular: Denies chest pain. Respiratory: Denies shortness of breath. Gastrointestinal: No abdominal pain.  No nausea, no vomiting.  No diarrhea.  No constipation. Genitourinary: Negative for dysuria. Musculoskeletal: Negative for back pain. Skin: Negative for rash. Neurological: Negative for headaches, focal weakness or numbness. Endocrine:Hypothyroidism ____________________________________________   PHYSICAL EXAM:  VITAL SIGNS: ED Triage Vitals [03/01/17 1327]  Enc Vitals Group     BP 116/64     Pulse Rate 90     Resp 18  Temp 98.9 F (37.2 C)     Temp Source Oral     SpO2 100 %     Weight 137 lb (62.1 kg)     Height 5\' 2"  (1.575 m)     Head Circumference      Peak Flow      Pain Score 7     Pain Loc      Pain Edu?      Excl. in GC?     Constitutional: Alert and oriented. Well appearing and in no  acute distress. Eyes: Conjunctivae are normal. PERRL. EOMI. Head: Atraumatic. Nose: No congestion/rhinnorhea. Mouth/Throat: Mucous membranes are moist.  Oropharynx non-erythematous. Neck: No stridor. No cervical spine tenderness to palpation. Hematological/Lymphatic/Immunilogical: No cervical lymphadenopathy. Cardiovascular: Normal rate, regular rhythm. Grossly normal heart sounds.  Good peripheral circulation. Respiratory: Normal respiratory effort.  No retractions. Lungs CTAB. Gastrointestinal: Soft and nontender. No distention. No abdominal bruits. No CVA tenderness. Musculoskeletal: No lower extremity tenderness nor edema.  No joint effusions. Neurologic:  Normal speech and language. No gross focal neurologic deficits are appreciated. No gait instability. Skin:  Skin is warm, dry and intact. No rash noted. Psychiatric: Mood and affect are normal. Speech and behavior are normal.  ____________________________________________   LABS (all labs ordered are listed, but only abnormal results are displayed)  Labs Reviewed - No data to display ____________________________________________  EKG   ____________________________________________  RADIOLOGY   ____________________________________________   PROCEDURES  Procedure(s) performed: None  Procedures  Critical Care performed: No  ____________________________________________   INITIAL IMPRESSION / ASSESSMENT AND PLAN / ED COURSE  Pertinent labs & imaging results that were available during my care of the patient were reviewed by me and considered in my medical decision making (see chart for details).  Increasing low back pain with gestation. Discussed with patient rationale for not performing imaging at this time. Advised patient discussed her OB doctor the use of an abdominal support for pregnancy. Patient refuses recommendation for decreased workload secondary to being a single parent with 2 other children. Patient will  follow up to follow up with obstetrician.      ____________________________________________   FINAL CLINICAL IMPRESSION(S) / ED DIAGNOSES  Final diagnoses:  Back pain affecting pregnancy in second trimester      NEW MEDICATIONS STARTED DURING THIS VISIT:  New Prescriptions   No medications on file     Note:  This document was prepared using Dragon voice recognition software and may include unintentional dictation errors.    Joni ReiningSmith, Artie Mcintyre K, PA-C 03/01/17 1415    Emily FilbertWilliams, Jonathan E, MD 03/01/17 [phone]

## 2017-03-01 NOTE — ED Notes (Signed)
Pt left without talking to this nurse after she discussed discharge with the provider

## 2017-03-01 NOTE — Discharge Instructions (Signed)
Discussed abdominal support with OB/GYN doctor.

## 2017-03-01 NOTE — ED Notes (Signed)
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

## 2017-03-02 ENCOUNTER — Ambulatory Visit
Admission: RE | Admit: 2017-03-02 | Discharge: 2017-03-02 | Disposition: A | Discharge status: Home / Self Care | Payer: Medicaid Other | Source: Ambulatory Visit | Attending: Maternal and Fetal Medicine | Admitting: Maternal and Fetal Medicine

## 2017-03-02 ENCOUNTER — Ambulatory Visit: Payer: Self-pay

## 2017-03-02 DIAGNOSIS — Z3A19 19 weeks gestation of pregnancy: Secondary | ICD-10-CM | POA: Insufficient documentation

## 2017-03-02 DIAGNOSIS — Z3689 Encounter for other specified antenatal screening: Secondary | ICD-10-CM | POA: Insufficient documentation

## 2017-03-02 DIAGNOSIS — O99312 Alcohol use complicating pregnancy, second trimester: Secondary | ICD-10-CM | POA: Insufficient documentation

## 2017-03-22 ENCOUNTER — Observation Stay
Admission: EM | Admit: 2017-03-22 | Discharge: 2017-03-22 | Disposition: A | Discharge status: Home / Self Care | Payer: Medicaid Other | Attending: Obstetrics and Gynecology | Admitting: Obstetrics and Gynecology

## 2017-03-22 DIAGNOSIS — R823 Hemoglobinuria: Secondary | ICD-10-CM | POA: Insufficient documentation

## 2017-03-22 DIAGNOSIS — O26892 Other specified pregnancy related conditions, second trimester: Principal | ICD-10-CM | POA: Insufficient documentation

## 2017-03-22 DIAGNOSIS — Z87442 Personal history of urinary calculi: Secondary | ICD-10-CM | POA: Diagnosis not present

## 2017-03-22 DIAGNOSIS — Z7982 Long term (current) use of aspirin: Secondary | ICD-10-CM | POA: Insufficient documentation

## 2017-03-22 DIAGNOSIS — Z3A22 22 weeks gestation of pregnancy: Secondary | ICD-10-CM | POA: Insufficient documentation

## 2017-03-22 DIAGNOSIS — R109 Unspecified abdominal pain: Secondary | ICD-10-CM | POA: Diagnosis present

## 2017-03-22 DIAGNOSIS — Z87440 Personal history of urinary (tract) infections: Secondary | ICD-10-CM | POA: Diagnosis not present

## 2017-03-22 DIAGNOSIS — Z87891 Personal history of nicotine dependence: Secondary | ICD-10-CM | POA: Diagnosis not present

## 2017-03-22 LAB — COMPREHENSIVE METABOLIC PANEL
ALT: 16 U/L (ref 14–54)
ANION GAP: 7 (ref 5–15)
AST: 21 U/L (ref 15–41)
Albumin: 3.2 g/dL — ABNORMAL LOW (ref 3.5–5.0)
Alkaline Phosphatase: 48 U/L (ref 38–126)
BUN: 13 mg/dL (ref 6–20)
CHLORIDE: 104 mmol/L (ref 101–111)
CO2: 25 mmol/L (ref 22–32)
Calcium: 9.5 mg/dL (ref 8.9–10.3)
Creatinine, Ser: 0.76 mg/dL (ref 0.44–1.00)
Glucose, Bld: 81 mg/dL (ref 65–99)
POTASSIUM: 3.7 mmol/L (ref 3.5–5.1)
Sodium: 136 mmol/L (ref 135–145)
Total Bilirubin: 0.4 mg/dL (ref 0.3–1.2)
Total Protein: 6.6 g/dL (ref 6.5–8.1)

## 2017-03-22 LAB — URINALYSIS, ROUTINE W REFLEX MICROSCOPIC
Bacteria, UA: NONE SEEN
Bilirubin Urine: NEGATIVE
Glucose, UA: NEGATIVE mg/dL
KETONES UR: NEGATIVE mg/dL
Leukocytes, UA: NEGATIVE
NITRITE: NEGATIVE
PROTEIN: NEGATIVE mg/dL
Specific Gravity, Urine: 1.006 (ref 1.005–1.030)
pH: 7 (ref 5.0–8.0)

## 2017-03-22 LAB — CBC
HEMATOCRIT: 31.9 % — AB (ref 35.0–47.0)
HEMOGLOBIN: 11 g/dL — AB (ref 12.0–16.0)
MCH: 34.3 pg — AB (ref 26.0–34.0)
MCHC: 34.4 g/dL (ref 32.0–36.0)
MCV: 99.7 fL (ref 80.0–100.0)
Platelets: 250 10*3/uL (ref 150–440)
RBC: 3.2 MIL/uL — AB (ref 3.80–5.20)
RDW: 13.6 % (ref 11.5–14.5)
WBC: 9.1 10*3/uL (ref 3.6–11.0)

## 2017-03-22 MED ORDER — HYDROCODONE-ACETAMINOPHEN 5-325 MG PO TABS
1.0000 | ORAL_TABLET | Freq: Four times a day (QID) | ORAL | Status: DC | PRN
  Administered 2017-03-22: 1 via ORAL

## 2017-03-22 MED ORDER — HYDROCODONE-ACETAMINOPHEN 5-325 MG PO TABS
ORAL_TABLET | ORAL | Status: AC
  Filled 2017-03-22: qty 1

## 2017-03-22 NOTE — OB Triage Note (Addendum)
Discharge instructions provided to patient and mother. Verbalizes understanding. Prescription given.  Phone number provided to [facility] for urine culture results.  Call on Saturday to look up results if [facility] hasn't contacted you.  Left voicemail message to patient after discharge and given renal U/S scheduling number, [phone].

## 2017-03-22 NOTE — OB Triage Note (Signed)
Pt presents c/o "lower back pain on the right side that radiates down to my groin area" that started around 12:00pm. Pt rates pain 10/10. Denies any NVD, bleeding or LOF. Reports positive fetal movement. Denies any burning during urination. Describes the pain as "feels like I have to push". Last date of intercourse was last night. Will continue to monitor.

## 2017-03-22 NOTE — Progress Notes (Signed)
Patient ID: Maureen Herman, female   DOB: 1986/07/03, 31 y.o.   MRN: [id number]  Maureen Herman is a 31 y.o. female. She is at [redacted]w[redacted]d gestation. Patient's last menstrual period was 10/15/2016. Estimated Date of Delivery: 07/22/17 G3P2  Prenatal care site:[facility]   Chief complaint: right flank pain for the last day . States that she has had multiple urinary tract infections . Currently no hematuria or dysuria + h/o urolithiasis. Some decrease fetal movt , but good since she has been here     Current Ob issues: + MJ use , h/o etoh dependance, hypothyroidism , + tobacco use , panic attacks   Maternal Medical History:   Past Medical History:  Diagnosis Date  . Kidney stones   . Liver laceration   . Pyelonephritis   . Thyroid disease     History reviewed. No pertinent surgical history.  No Known Allergies  Prior to Admission medications   Medication Sig Start Date End Date Taking? Authorizing Provider  Prenatal Vit-Fe Fumarate-FA (MULTIVITAMIN-PRENATAL) 27-0.8 MG TABS tablet Take 1 tablet by mouth daily at 12 noon.   Yes [provider]  aspirin 81 MG chewable tablet Chew by mouth daily.    [provider]  brompheniramine-pseudoephedrine-DM 30-2-10 MG/5ML syrup Take 5 mLs by mouth 4 (four) times daily as needed. Patient not taking: Reported on 01/05/2017 08/26/16   Joni Reining, PA-C  HYDROcodone-acetaminophen [facility]) 5-325 MG tablet Take 1 tablet by mouth every 6 (six) hours as needed for severe pain. Patient not taking: Reported on 07/04/2016 03/09/16   Hagler, Jami L, PA-C  HYDROcodone-acetaminophen (NORCO) 5-325 MG tablet Take 1 tablet by mouth every 6 (six) hours as needed for moderate pain. Pharmacist:  Do not fill without filling ABX script Patient not taking: Reported on 01/05/2017 07/04/16   Willy Eddy, MD  ondansetron (ZOFRAN ODT) 4 MG disintegrating tablet Take 1 tablet (4 mg total) by mouth every 8 (eight) hours as needed for nausea or vomiting. Patient  not taking: Reported on 07/04/2016 03/09/16   Hagler, Jami L, PA-C  promethazine (PHENERGAN) 12.5 MG tablet Take 1 tablet (12.5 mg total) by mouth every 6 (six) hours as needed for nausea or vomiting. Patient not taking: Reported on 01/05/2017 07/04/16   Willy Eddy, MD     Social History: She  reports that she has quit smoking. Her smoking use included Cigarettes. She smoked 0.10 packs per day. She has never used smokeless tobacco. She reports that she drinks alcohol. She reports that she does not use drugs.  Family History: family history is not on file.  no history of gyn cancers  Review of Systems: A full review of systems was performed and negative except as noted in the HPI.   genetral : no weight loss Endocrine + Hypothyroidism  CV no Chest pain  Pulm : No SOB  GI :No colitis , IBD ,  GU + urogenital trichomiasis, Multiple UTI and Urolithiasis M/S : no weakness  O:  BP 120/68   Pulse 80   Temp 98.1 F (36.7 C) (Oral)   Resp 20   Ht 5\' 2"  (1.575 m)   Wt 64.9 kg (143 lb)   LMP 10/15/2016   SpO2 100%   BMI 26.16 kg/m  Results for orders placed or performed during the hospital encounter of 03/22/17 (from the past 48 hour(s))  Urinalysis, Routine w reflex microscopic   Collection Time: 03/22/17  6:43 PM  Result Value Ref Range   Color, Urine YELLOW (A)  YELLOW   APPearance HAZY (A) CLEAR   Specific Gravity, Urine 1.006 1.005 - 1.030   pH 7.0 5.0 - 8.0   Glucose, UA NEGATIVE NEGATIVE mg/dL   Hgb urine dipstick LARGE (A) NEGATIVE   Bilirubin Urine NEGATIVE NEGATIVE   Ketones, ur NEGATIVE NEGATIVE mg/dL   Protein, ur NEGATIVE NEGATIVE mg/dL   Nitrite NEGATIVE NEGATIVE   Leukocytes, UA NEGATIVE NEGATIVE   RBC / HPF 0-5 0 - 5 RBC/hpf   WBC, UA 0-5 0 - 5 WBC/hpf   Bacteria, UA NONE SEEN NONE SEEN   Squamous Epithelial / LPF 6-30 (A) NONE SEEN   Mucous PRESENT   Comprehensive metabolic panel   Collection Time: 03/22/17  6:51 PM  Result Value Ref Range   Sodium 136  135 - 145 mmol/L   Potassium 3.7 3.5 - 5.1 mmol/L   Chloride 104 101 - 111 mmol/L   CO2 25 22 - 32 mmol/L   Glucose, Bld 81 65 - 99 mg/dL   BUN 13 6 - 20 mg/dL   Creatinine, Ser 1.610.76 0.44 - 1.00 mg/dL   Calcium 9.5 8.9 - 09.610.3 mg/dL   Total Protein 6.6 6.5 - 8.1 g/dL   Albumin 3.2 (L) 3.5 - 5.0 g/dL   AST 21 15 - 41 U/L   ALT 16 14 - 54 U/L   Alkaline Phosphatase 48 38 - 126 U/L   Total Bilirubin 0.4 0.3 - 1.2 mg/dL   GFR calc non Af Amer >60 >60 mL/min   GFR calc Af Amer >60 >60 mL/min   Anion gap 7 5 - 15  CBC   Collection Time: 03/22/17  6:51 PM  Result Value Ref Range   WBC 9.1 3.6 - 11.0 K/uL   RBC 3.20 (L) 3.80 - 5.20 MIL/uL   Hemoglobin 11.0 (L) 12.0 - 16.0 g/dL   HCT 04.531.9 (L) 40.935.0 - 81.147.0 %   MCV 99.7 80.0 - 100.0 fL   MCH 34.3 (H) 26.0 - 34.0 pg   MCHC 34.4 32.0 - 36.0 g/dL   RDW 91.413.6 78.211.5 - 95.614.5 %   Platelets 250 150 - 440 K/uL     Constitutional: NAD, AAOx3  HE/ENT: extraocular movements grossly intact, moist mucous membranes CV: RRR PULM: nl respiratory effort, CTABL     Abd: gravid, non-tender, non-distended, soft   bachk + Right Back : +CVAT    Ext: Non-tender, Nonedmeatous   Psych: mood appropriate, speech normal Pelvic deferred  NST: fhr 130's    A/P: 31 y.o. 3035w4d here for antenatal surveillance for right flank pain . Ua is not c/w UTI , concerning that she has hemoglobinuria , etiology ??  Possible early UTI will get urine culture and schedule a renal u/s as an outpt.  Norco 5/325 mg #15 given  Follow up with [facility] for continued workup for hemoglobinuria  PTLabor: not present.  ----- Ihor Austinhomas J Harun Brumley MD  Attending Obstetrician and Gynecologist [facility], Department of OB/GYN [facility]

## 2017-03-22 NOTE — Discharge Summary (Signed)
Alvah Lagrow, Ihor Austinhomas J, MD  Obstetrics    [] Hide copied text [] Hover for attribution information Patient ID: Maureen Herman, female   DOB: Jun 04, 1986, 31 y.o.   MRN: [id number]  Maureen Herman is a 31 y.o. female. She is at 5280w4d gestation. Patient's last menstrual period was 10/15/2016. Estimated Date of Delivery: 07/22/17 G3P2  Prenatal care site:[facility]   Chief complaint: right flank pain for the last day . States that she has had multiple urinary tract infections . Currently no hematuria or dysuria + h/o urolithiasis. Some decrease fetal movt , but good since she has been here     Current Ob issues: + MJ use , h/o etoh dependance, hypothyroidism , + tobacco use , panic attacks   Maternal Medical History:       Past Medical History:  Diagnosis Date  . Kidney stones   . Liver laceration   . Pyelonephritis   . Thyroid disease     History reviewed. No pertinent surgical history.  No Known Allergies         Prior to Admission medications   Medication Sig Start Date End Date Taking? Authorizing Provider  Prenatal Vit-Fe Fumarate-FA (MULTIVITAMIN-PRENATAL) 27-0.8 MG TABS tablet Take 1 tablet by mouth daily at 12 noon.   Yes [provider]  aspirin 81 MG chewable tablet Chew by mouth daily.    [provider]  brompheniramine-pseudoephedrine-DM 30-2-10 MG/5ML syrup Take 5 mLs by mouth 4 (four) times daily as needed. Patient not taking: Reported on 01/05/2017 08/26/16   Joni ReiningSmith, Ronald K, PA-C  HYDROcodone-acetaminophen [facility](NORCO) 5-325 MG tablet Take 1 tablet by mouth every 6 (six) hours as needed for severe pain. Patient not taking: Reported on 07/04/2016 03/09/16   Hagler, Jami L, PA-C  HYDROcodone-acetaminophen (NORCO) 5-325 MG tablet Take 1 tablet by mouth every 6 (six) hours as needed for moderate pain. Pharmacist:  Do not fill without filling ABX script Patient not taking: Reported on 01/05/2017 07/04/16   Willy Eddyobinson, Patrick, MD  ondansetron  (ZOFRAN ODT) 4 MG disintegrating tablet Take 1 tablet (4 mg total) by mouth every 8 (eight) hours as needed for nausea or vomiting. Patient not taking: Reported on 07/04/2016 03/09/16   Hagler, Jami L, PA-C  promethazine (PHENERGAN) 12.5 MG tablet Take 1 tablet (12.5 mg total) by mouth every 6 (six) hours as needed for nausea or vomiting. Patient not taking: Reported on 01/05/2017 07/04/16   Willy Eddyobinson, Patrick, MD     Social History: She  reports that she has quit smoking. Her smoking use included Cigarettes. She smoked 0.10 packs per day. She has never used smokeless tobacco. She reports that she drinks alcohol. She reports that she does not use drugs.  Family History: family history is not on file.  no history of gyn cancers  Review of Systems: A full review of systems was performed and negative except as noted in the HPI.   genetral : no weight loss Endocrine + Hypothyroidism  CV no Chest pain  Pulm : No SOB  GI :No colitis , IBD ,  GU + urogenital trichomiasis, Multiple UTI and Urolithiasis M/S : no weakness  O:  BP 120/68   Pulse 80   Temp 98.1 F (36.7 C) (Oral)   Resp 20   Ht 5\' 2"  (1.575 m)   Wt 64.9 kg (143 lb)   LMP 10/15/2016   SpO2 100%   BMI 26.16 kg/m       Results for orders placed or performed during the hospital encounter  of 03/22/17 (from the past 48 hour(s))  Urinalysis, Routine w reflex microscopic   Collection Time: 03/22/17  6:43 PM  Result Value Ref Range   Color, Urine YELLOW (A) YELLOW   APPearance HAZY (A) CLEAR   Specific Gravity, Urine 1.006 1.005 - 1.030   pH 7.0 5.0 - 8.0   Glucose, UA NEGATIVE NEGATIVE mg/dL   Hgb urine dipstick LARGE (A) NEGATIVE   Bilirubin Urine NEGATIVE NEGATIVE   Ketones, ur NEGATIVE NEGATIVE mg/dL   Protein, ur NEGATIVE NEGATIVE mg/dL   Nitrite NEGATIVE NEGATIVE   Leukocytes, UA NEGATIVE NEGATIVE   RBC / HPF 0-5 0 - 5 RBC/hpf   WBC, UA 0-5 0 - 5 WBC/hpf   Bacteria, UA NONE SEEN NONE SEEN    Squamous Epithelial / LPF 6-30 (A) NONE SEEN   Mucous PRESENT   Comprehensive metabolic panel   Collection Time: 03/22/17  6:51 PM  Result Value Ref Range   Sodium 136 135 - 145 mmol/L   Potassium 3.7 3.5 - 5.1 mmol/L   Chloride 104 101 - 111 mmol/L   CO2 25 22 - 32 mmol/L   Glucose, Bld 81 65 - 99 mg/dL   BUN 13 6 - 20 mg/dL   Creatinine, Ser 1.61 0.44 - 1.00 mg/dL   Calcium 9.5 8.9 - 09.6 mg/dL   Total Protein 6.6 6.5 - 8.1 g/dL   Albumin 3.2 (L) 3.5 - 5.0 g/dL   AST 21 15 - 41 U/L   ALT 16 14 - 54 U/L   Alkaline Phosphatase 48 38 - 126 U/L   Total Bilirubin 0.4 0.3 - 1.2 mg/dL   GFR calc non Af Amer >60 >60 mL/min   GFR calc Af Amer >60 >60 mL/min   Anion gap 7 5 - 15  CBC   Collection Time: 03/22/17  6:51 PM  Result Value Ref Range   WBC 9.1 3.6 - 11.0 K/uL   RBC 3.20 (L) 3.80 - 5.20 MIL/uL   Hemoglobin 11.0 (L) 12.0 - 16.0 g/dL   HCT 04.5 (L) 40.9 - 81.1 %   MCV 99.7 80.0 - 100.0 fL   MCH 34.3 (H) 26.0 - 34.0 pg   MCHC 34.4 32.0 - 36.0 g/dL   RDW 91.4 78.2 - 95.6 %   Platelets 250 150 - 440 K/uL   Constitutional: NAD, AAOx3  HE/ENT: extraocular movements grossly intact, moist mucous membranes CV: RRR PULM: nl respiratory effort, CTABL                                         Abd: gravid, non-tender, non-distended, soft                         bachk + Right Back : +CVAT               Ext: Non-tender, Nonedmeatous                     Psych: mood appropriate, speech normal Pelvic deferred  NST: fhr 130's    A/P: 31 y.o. [redacted]w[redacted]d here for antenatal surveillance for right flank pain . Ua is not c/w UTI , concerning that she has hemoglobinuria , etiology ??  Possible early UTI will get urine culture and schedule a renal u/s as an outpt.  Norco 5/325 mg #15 given  Follow up with [facility] for continued  workup for hemoglobinuria  PTLabor: not present.  ----- Ihor Austin Aslan Himes MD  Attending Obstetrician and Gynecologist [facility], Department of OB/GYN [facility]     Electronically signed by Jennye Runquist, Ihor Austin, MD at 03/22/2017 9:20 PM

## 2017-03-24 LAB — URINE CULTURE: SPECIAL REQUESTS: NORMAL

## 2017-04-01 ENCOUNTER — Emergency Department
Admission: EM | Admit: 2017-04-01 | Discharge: 2017-04-01 | Disposition: A | Discharge status: Home / Self Care | Payer: Medicaid Other | Attending: Emergency Medicine | Admitting: Emergency Medicine

## 2017-04-01 ENCOUNTER — Encounter: Payer: Self-pay | Admitting: Emergency Medicine

## 2017-04-01 DIAGNOSIS — K011 Impacted teeth: Secondary | ICD-10-CM | POA: Insufficient documentation

## 2017-04-01 DIAGNOSIS — O9989 Other specified diseases and conditions complicating pregnancy, childbirth and the puerperium: Secondary | ICD-10-CM | POA: Diagnosis present

## 2017-04-01 DIAGNOSIS — Z87891 Personal history of nicotine dependence: Secondary | ICD-10-CM | POA: Insufficient documentation

## 2017-04-01 DIAGNOSIS — Z7982 Long term (current) use of aspirin: Secondary | ICD-10-CM | POA: Diagnosis not present

## 2017-04-01 DIAGNOSIS — Z3A24 24 weeks gestation of pregnancy: Secondary | ICD-10-CM | POA: Diagnosis not present

## 2017-04-01 MED ORDER — LIDOCAINE VISCOUS 2 % MT SOLN
15.0000 mL | Freq: Once | OROMUCOSAL | Status: AC
  Administered 2017-04-01: 15 mL via OROMUCOSAL
  Filled 2017-04-01: qty 15

## 2017-04-01 MED ORDER — AMOXICILLIN 500 MG PO CAPS: 500.0000 mg | ORAL_CAPSULE | Freq: Three times a day (TID) | ORAL | 0 refills | Status: AC

## 2017-04-01 MED ORDER — LIDOCAINE VISCOUS 2 % MT SOLN: 10.0000 mL | OROMUCOSAL | 0 refills | Status: AC | PRN

## 2017-04-01 NOTE — ED Triage Notes (Signed)
R lower toothache since yesterday. Patient is [redacted] weeks pregnant with no symptoms related to pregnancy.

## 2017-04-01 NOTE — ED Notes (Signed)
See triage note  States she developed dental pain yesterday  Pain is mainly to right lower side

## 2017-04-01 NOTE — ED Provider Notes (Signed)
[facility] Emergency Department Provider Note  ____________________________________________  Time seen: Approximately 1:16 PM  I have reviewed the triage vital signs and the nursing notes.   HISTORY  Chief Complaint Dental Pain    HPI Maureen Herman is a 31 y.o. female that presents to emergency department with lower right tooth pain for 1 day and is concerned that she has an impacted molar. She does not have a dentist. She is taking Tylenol for pain. Patient is [redacted] weeks pregnant and is not having any abdominal pain or vaginal discharge. She denies fever, jaw pain, drainage from mouth, swelling, shortness of breath, chest pain, nausea, vomiting, abdominal pain.   Past Medical History:  Diagnosis Date  . Kidney stones   . Liver laceration   . Pyelonephritis   . Thyroid disease     Patient Active Problem List   Diagnosis Date Noted  . Right flank pain 03/22/2017  . Abnormal first trimester screen   . 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 First trimester screening     History reviewed. No pertinent surgical history.  Prior to Admission medications   Medication Sig Start Date End Date Taking? Authorizing Provider  amoxicillin (AMOXIL) 500 MG capsule Take 1 capsule (500 mg total) by mouth 3 (three) times daily. 04/01/17   Enid Derry, PA-C  aspirin 81 MG chewable tablet Chew by mouth daily.    [provider]  brompheniramine-pseudoephedrine-DM 30-2-10 MG/5ML syrup Take 5 mLs by mouth 4 (four) times daily as needed. Patient not taking: Reported on 01/05/2017 08/26/16   Joni Reining, PA-C  HYDROcodone-acetaminophen [facility]) 5-325 MG tablet Take 1 tablet by mouth every 6 (six) hours as needed for severe pain. Patient not taking: Reported on 07/04/2016 03/09/16   Hagler, Jami L, PA-C  HYDROcodone-acetaminophen (NORCO) 5-325 MG tablet Take 1 tablet by mouth every 6 (six) hours as needed for moderate pain. Pharmacist:  Do not fill  without filling ABX script Patient not taking: Reported on 01/05/2017 07/04/16   Willy Eddy, MD  lidocaine (XYLOCAINE) 2 % solution Use as directed 10 mLs in the mouth or throat as needed for mouth pain. 04/01/17   Enid Derry, PA-C  ondansetron (ZOFRAN ODT) 4 MG disintegrating tablet Take 1 tablet (4 mg total) by mouth every 8 (eight) hours as needed for nausea or vomiting. Patient not taking: Reported on 07/04/2016 03/09/16   Hagler, Jami L, PA-C  Prenatal Vit-Fe Fumarate-FA (MULTIVITAMIN-PRENATAL) 27-0.8 MG TABS tablet Take 1 tablet by mouth daily at 12 noon.    [provider]  promethazine (PHENERGAN) 12.5 MG tablet Take 1 tablet (12.5 mg total) by mouth every 6 (six) hours as needed for nausea or vomiting. Patient not taking: Reported on 01/05/2017 07/04/16   Willy Eddy, MD    Allergies Patient has no known allergies.  No family history on file.  Social History Social History  Substance Use Topics  . Smoking status: Former Smoker    Packs/day: 0.10    Types: Cigarettes  . Smokeless tobacco: Never Used  . Alcohol use Yes     Comment: not during pregnancy     Review of Systems  Constitutional: No fever/chills Cardiovascular: No chest pain. Respiratory: No SOB. Gastrointestinal: No abdominal pain.  No nausea, no vomiting.  Musculoskeletal: Negative for musculoskeletal pain. Skin: Negative for rash, abrasions, lacerations, ecchymosis. Neurological: Negative for headaches, numbness or tingling   ____________________________________________   PHYSICAL EXAM:  VITAL SIGNS: ED Triage Vitals  Enc Vitals  Group     BP 04/01/17 1203 128/73     Pulse Rate 04/01/17 1203 86     Resp 04/01/17 1203 20     Temp 04/01/17 1203 98.3 F (36.8 C)     Temp Source 04/01/17 1203 Oral     SpO2 04/01/17 1203 100 %     Weight 04/01/17 1205 140 lb (63.5 kg)     Height 04/01/17 1205 5\' 2"  (1.575 m)     Head Circumference --      Peak Flow --      Pain Score 04/01/17  1203 9     Pain Loc --      Pain Edu? --      Excl. in GC? --      Constitutional: Alert and oriented. Well appearing and in no acute distress. Eyes: Conjunctivae are normal. PERRL. EOMI. Head: Atraumatic. ENT:      Ears:      Nose: No congestion/rhinnorhea.      Mouth/Throat: Mucous membranes are moist. Lower right impacted molar. No swelling or drainage from mouth. No TMJ pain. No difficulty opening and closing mouth. Neck: No stridor.   Cardiovascular: Normal rate, regular rhythm.  Good peripheral circulation. Respiratory: Normal respiratory effort without tachypnea or retractions. Lungs CTAB. Good air entry to the bases with no decreased or absent breath sounds. Musculoskeletal: Full range of motion to all extremities. No gross deformities appreciated. Neurologic:  Normal speech and language. No gross focal neurologic deficits are appreciated.  Skin:  Skin is warm, dry and intact. No rash noted.   ____________________________________________   LABS (all labs ordered are listed, but only abnormal results are displayed)  Labs Reviewed - No data to display ____________________________________________  EKG   ____________________________________________  RADIOLOGY  No results found.  ____________________________________________    PROCEDURES  Procedure(s) performed:    Procedures    Medications  lidocaine (XYLOCAINE) 2 % viscous mouth solution 15 mL (15 mLs Mouth/Throat Given 04/01/17 1347)     ____________________________________________   INITIAL IMPRESSION / ASSESSMENT AND PLAN / ED COURSE  Pertinent labs & imaging results that were available during my care of the patient were reviewed by me and considered in my medical decision making (see chart for details).  Review of the  CSRS was performed in accordance of the [facility] prior to dispensing any controlled drugs.     Patient's diagnosis is consistent with impacted tooth. Vital signs and exam are  reassuring. Fetal heart tones heard at 142 bpm. Patient will be discharged home with prescriptions for amoxicillin and viscous lidocaine. Patient is to follow up with dentist as directed. Patient is given ED precautions to return to the ED for any worsening or new symptoms.     ____________________________________________  FINAL CLINICAL IMPRESSION(S) / ED DIAGNOSES  Final diagnoses:  Impacted tooth      NEW MEDICATIONS STARTED DURING THIS VISIT:  Discharge Medication List as of 04/01/2017  1:50 PM    START taking these medications   Details  amoxicillin (AMOXIL) 500 MG capsule Take 1 capsule (500 mg total) by mouth 3 (three) times daily., Starting Sat 04/01/2017, Print    lidocaine (XYLOCAINE) 2 % solution Use as directed 10 mLs in the mouth or throat as needed for mouth pain., Starting Sat 04/01/2017, Print            This chart was dictated using voice recognition software/Dragon. Despite best efforts to proofread, errors can occur which can change the meaning. Any change was purely  unintentional.    Enid DerryWagner, Larnce Schnackenberg, PA-C 04/01/17 1610    Governor RooksLord, Rebecca, MD 04/02/17 [phone]

## 2017-04-01 NOTE — Discharge Instructions (Signed)
OPTIONS FOR DENTAL FOLLOW UP CARE ° °[place] [facility] [employer] [facility] - Local Safety Net Dental Clinics °http://www.ncdhhs.gov/dph/oralhealth/services/safetynetclinics.htm °  °[facility] ([phone]) ° °[facility] [place] ([phone]) ° °[facility] ([phone] ext [phone]) ° °[facility] ([phone]) ° °[facility] ([phone]) °This clinic caters to the indigent population and is on a lottery system. °Location: °[facility], [address], [place] °Clinic Hours: °Wednesdays from 6pm - 9pm, patients seen by a lottery system. °For dates, call or go to [email or web address]/[facility]/patients/[email or web address] °Services: °Cleanings, fillings and simple extractions. °Payment Options: °DENTAL WORK IS FREE OF CHARGE. Bring proof of income or support. °Best way to get seen: °Arrive at 5:15 pm - this is a lottery, NOT first come/first serve, so arriving earlier will not increase your chances of being seen. °  °  °[facility] °[phone] °Select option 1 for emergencies °  °Location: °[facility], [address], [place] °Clinic Hours: °No walk-ins accepted - call the day before to schedule an appointment. °Check in times are 9:30 am and 1:30 pm. °Services: °Simple extractions, temporary fillings, pulpectomy/pulp debridement, uncomplicated abscess drainage. °Payment Options: °PAYMENT IS DUE AT THE TIME OF SERVICE.  Fee is usually $100-200, additional surgical procedures (e.g. abscess drainage) may be extra. °Cash, checks, Visa/MasterCard accepted.  Can file Medicaid if patient is covered for dental - patient should call case worker to check. °No discount for [facility] patients. °Best way to get seen: °MUST call the day before and get onto the schedule. Can usually be seen the next 1-2 days. No walk-ins accepted. °  °  °[facility] °[phone] °   °Location: °[facility], [address], [place] °Clinic Hours: °M, W, Th, F 8am or 1:30pm, Tues 9a or 1:30 - first come/first served. °Services: °Simple extractions, temporary fillings, uncomplicated abscess drainage.  You do not need to be an [place] resident. °Payment Options: °PAYMENT IS DUE AT THE TIME OF SERVICE. °Dental insurance, otherwise sliding scale - bring proof of income or support. °Depending on income and treatment needed, cost is usually $50-200. °Best way to get seen: °Arrive early as it is first come/first served. °  °  °[facility] °[phone] °  °Location: °[address] °Clinic Hours: °Mon-Thu 8a-5p °Services: °Most basic dental services including extractions and fillings. °Payment Options: °PAYMENT IS DUE AT THE TIME OF SERVICE. °Sliding scale, up to 50% off - bring proof if income or support. °Medicaid with dental option accepted. °Best way to get seen: °Call to schedule an appointment, can usually be seen within 2 weeks OR they will try to see walk-ins - show up at 8a or 2p (you may have to wait). °  °  °[facility] °[phone] °[place] RESIDENTS ONLY °  °Location: °[facility], [address], [place], [place] [address] °Clinic Hours: By appointment only. °Monday - Thursday 8am-5pm, Friday 8am-12pm °Services: Cleanings, fillings, extractions. °Payment Options: °PAYMENT IS DUE AT THE TIME OF SERVICE. °Cash, Visa or MasterCard. Sliding scale - $30 minimum per service. °Best way to get seen: °Come in to office, complete packet and make an appointment - need proof of income °or support monies for each household member and proof of [place] residence. °Usually takes about a month to get in. °  °  °[facility] °[phone] °  °Location: °[address]   [place] °Clinic Hours: Walk-in Urgent Care Dental Services are offered Monday-Friday  mornings only. °The numbers of emergencies accepted daily is limited to the number of °providers available. °Maximum 15 - Mondays, Wednesdays & Thursdays °Maximum 10 - Tuesdays & Fridays °Services: °You do not need to be a  [place] resident to be seen for a dental emergency. °Emergencies are defined as pain, swelling, abnormal bleeding, or dental trauma. Walkins will receive x-rays if needed. °NOTE: Dental cleaning is not an emergency. °Payment Options: °PAYMENT IS DUE AT THE TIME OF SERVICE. °Minimum co-pay is $40.00 for uninsured patients. °Minimum co-pay is $3.00 for Medicaid with dental coverage. °Dental Insurance is accepted and must be presented at time of visit. °Medicare does not cover dental. °Forms of payment: Cash, credit card, checks. °Best way to get seen: °If not previously registered with the clinic, walk-in dental registration begins at 7:15 am and is on a first come/first serve basis. °If previously registered with the clinic, call to make an appointment. °  °  °[facility] °[phone] °[place] RESIDENTS ONLY °  °Location: °[address], [place], [place] °Clinic Hours: °Mon-Thu 10a-2p °Services: Extractions only! °Payment Options: °FREE (donations accepted) - bring proof of income or support °Best way to get seen: °Call and schedule an appointment OR come at 8am on the 1st Monday of every month (except for holidays) when it is first come/first served. °  °  °[facility] °[phone] °  °Location: °[address], [place] °Clinic Hours: °Friday mornings °Services, Payment Options, Best way to get seen: °Call for info °

## 2017-10-14 ENCOUNTER — Encounter (HOSPITAL_COMMUNITY): Payer: Self-pay

## 2017-11-16 IMAGING — CT CT RENAL STONE PROTOCOL
2 of 4 series · 16 of 46 positions shown, 18 images · non-contrast
Comparison: CT 01/09/2016.

CLINICAL DATA: Bilateral flank pain. History of urinary tract
calculi, urinary tract infections and hepatic laceration.

EXAM:
CT ABDOMEN AND PELVIS WITHOUT CONTRAST
TECHNIQUE: Multidetector CT imaging of the abdomen and pelvis was performed
following the standard protocol without IV contrast.

[Series 2: axial st · axial · 0.59mm/px · z∈[-432,-142]mm · 13 of 66 slices shown, 15 images]
[im 5/66  soft-tissue]
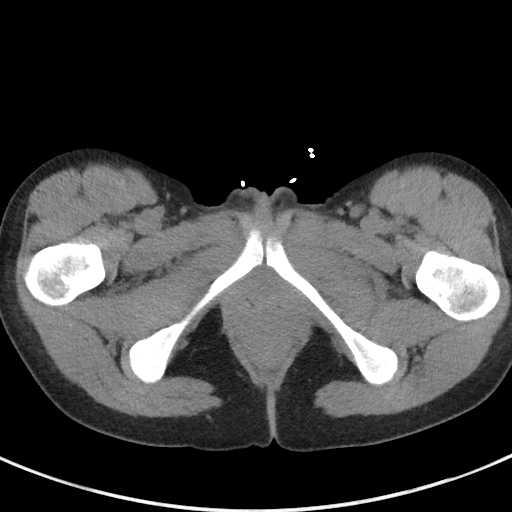
[im 5/66  bone]
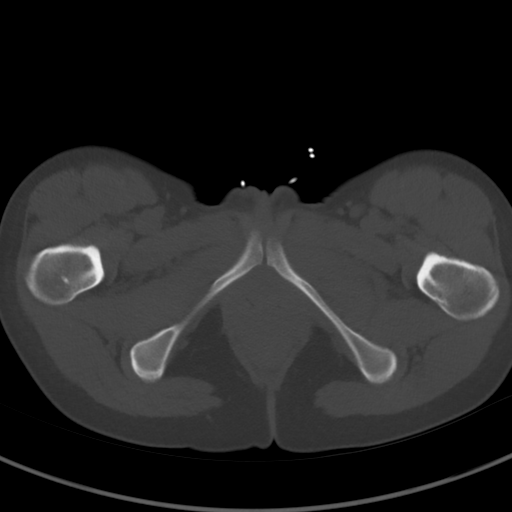
[im 10/66  soft-tissue]
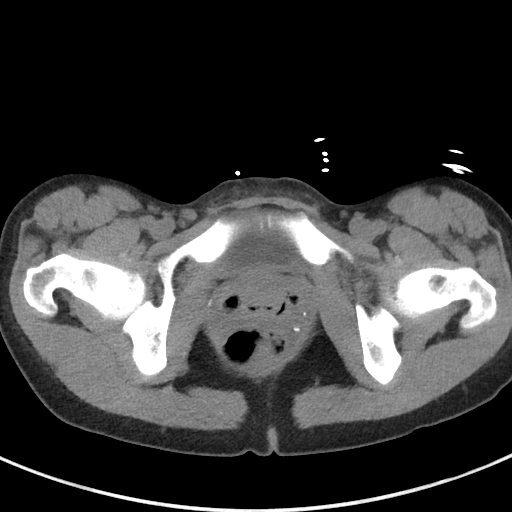
[im 15/66  soft-tissue]
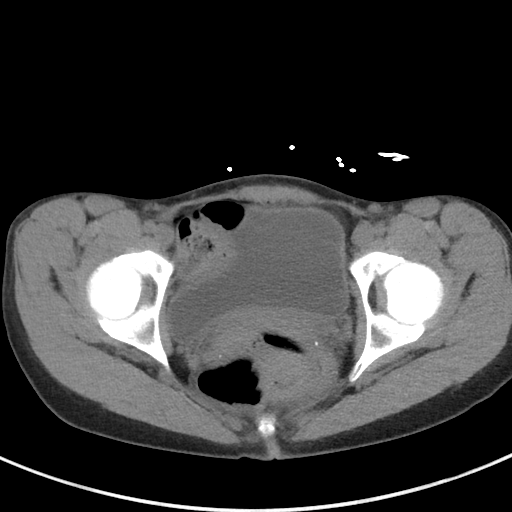
[im 20/66  soft-tissue]
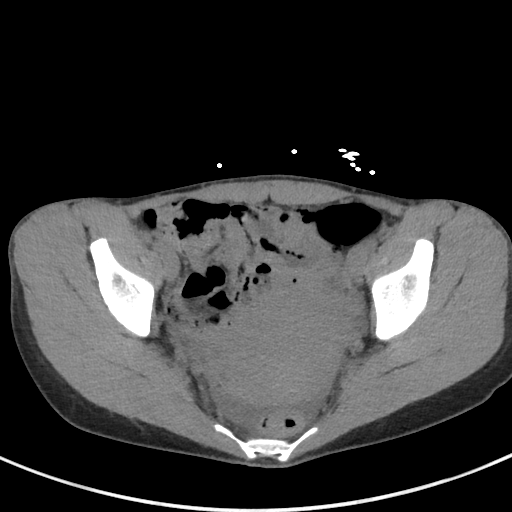
[im 25/66  soft-tissue]
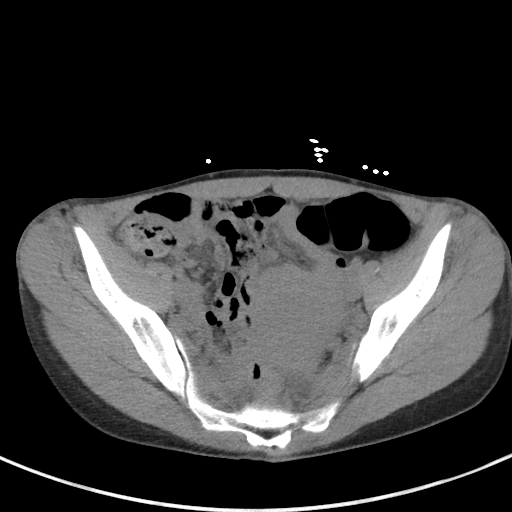
[im 29/66  soft-tissue]
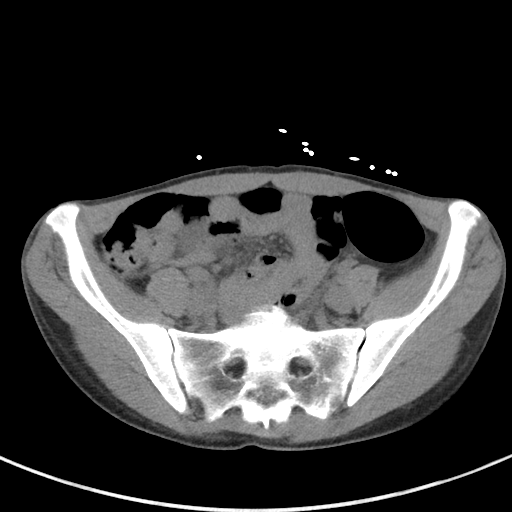
[im 34/66  soft-tissue]
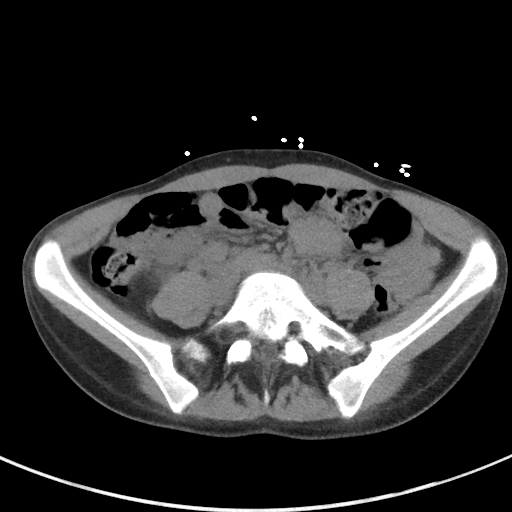
[im 39/66  soft-tissue]
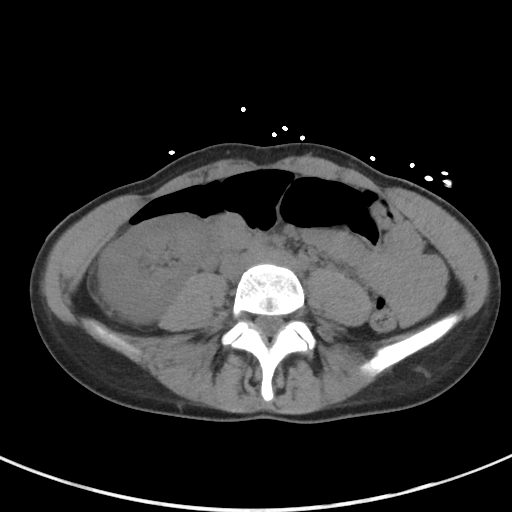
[im 44/66  soft-tissue]
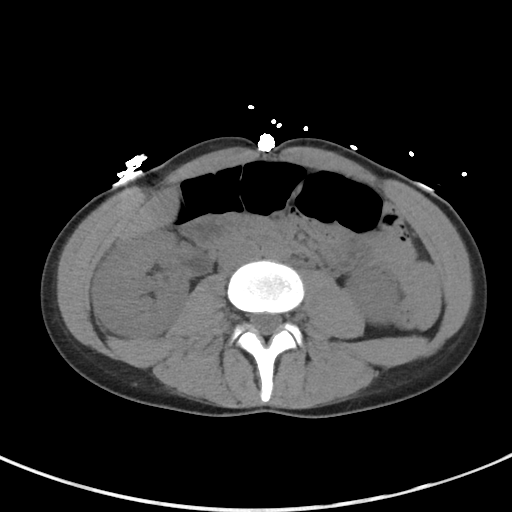
[im 44/66  bone]
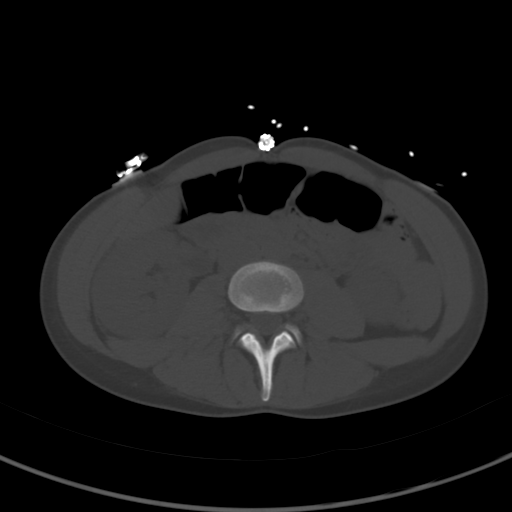
[im 49/66  soft-tissue]
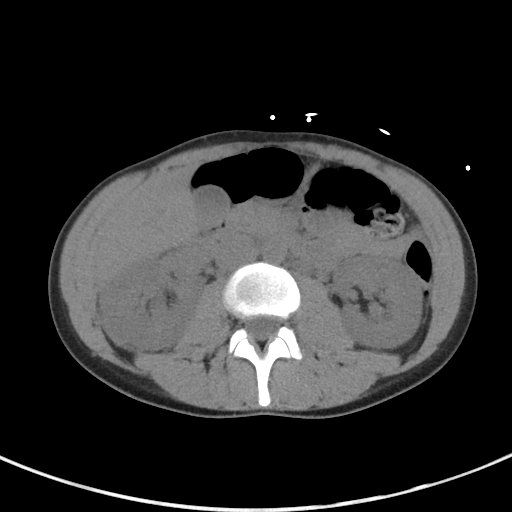
[im 53/66  soft-tissue]
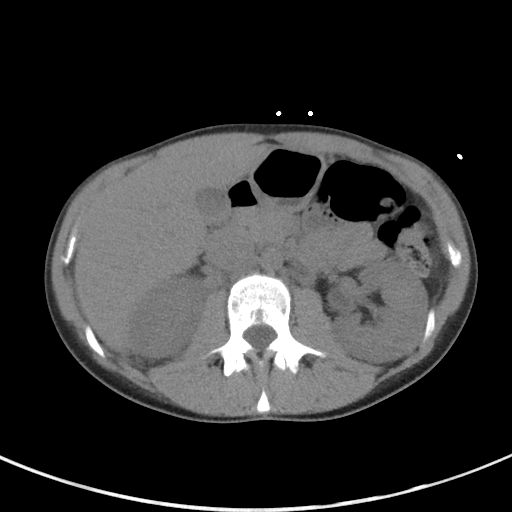
[im 58/66  soft-tissue]
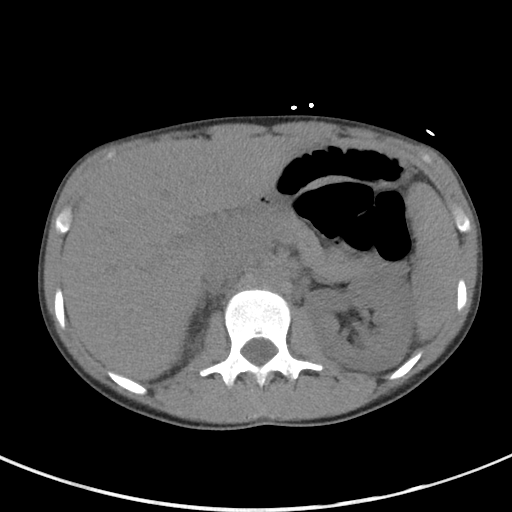
[im 63/66  soft-tissue]
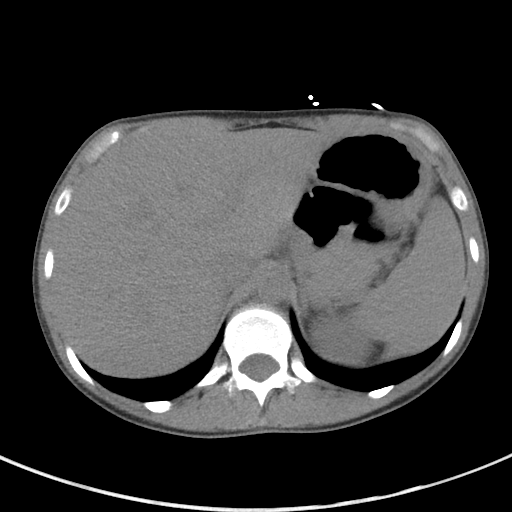

[Series 5: coronal · coronal · 0.59mm/px · 3 of 106 slices shown]
[im 36/106  soft-tissue]
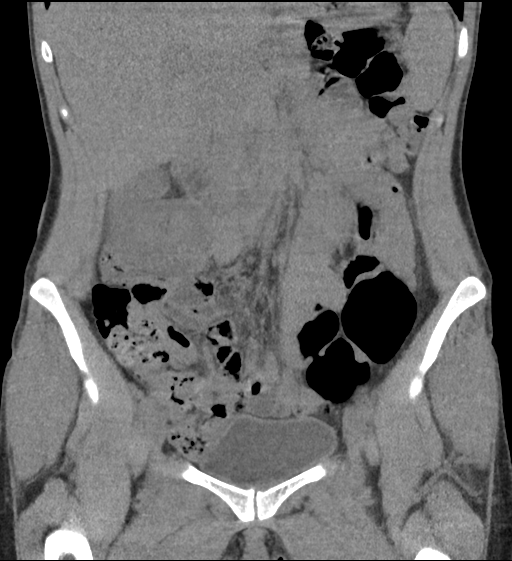
[im 47/106  soft-tissue]
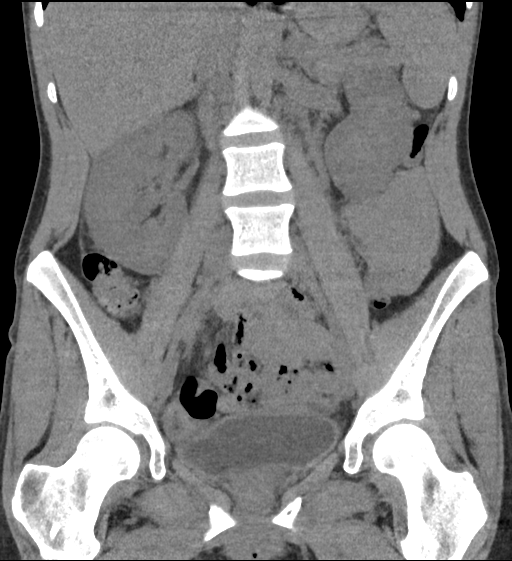
[im 59/106  soft-tissue]
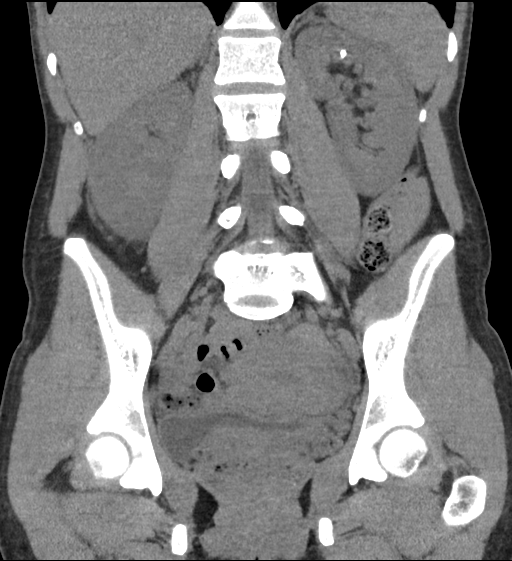

[16 of 46 positions shown; findings below may reference images not displayed]

FINDINGS: Lower chest:  The visualized lower chest appears unremarkable.

Hepatobiliary: The liver is incompletely visualized by this
technique/ indication. The visualized liver appears unremarkable
without apparent abnormality of the previously demonstrated
laceration. No evidence of gallstones, gallbladder wall thickening
or biliary dilatation.

Pancreas: Unremarkable. No pancreatic ductal dilatation or
surrounding inflammatory changes.

Spleen: Normal in size without focal abnormality.

Adrenals/Urinary Tract: Both adrenal glands appear normal. 5 mm
nonobstructing calculus in the upper pole the left kidney (image 7)
is unchanged. There are additional tiny nonobstructing bilateral
renal calculi. There is possible mild perinephric soft tissue
stranding on the right. No significant ureteral dilatation is seen.
The distal ureters are not optimally visualized due to a paucity of
intra-abdominal fat. However, no definite ureteral or bladder
calculi are evident. There are multiple pelvic calcifications which
are grossly stable and attributed to phleboliths. The bladder
appears unremarkable.

Stomach/Bowel: No evidence of bowel wall thickening, distention or
surrounding inflammatory change. Bowel assessment limited by the
lack of oral and intravenous contrast.

Vascular/Lymphatic: There are no enlarged abdominal or pelvic lymph
nodes. No significant vascular findings on noncontrast imaging.

Reproductive: The uterus and adnexa appear unremarkable.

Other: There is a small amount of free pelvic fluid which has
decreased in volume compared with the prior study. There is no
generalized ascites.

Musculoskeletal: No acute or significant osseous findings.
IMPRESSION: 1. Nonobstructing bilateral renal calculi.
2. No definite ureteral calculus or hydronephrosis. Bilateral pelvic
calcifications are grossly stable, likely all phleboliths. There may
be mild perinephric soft tissue stranding on the right, a finding
which could be secondary to a recently passed ureteral calculus or
urinary tract infection.

## 2019-01-25 IMAGING — US US OB COMP LESS 14 WK
1 series · 14 of 28 positions shown · non-contrast
Comparison: None.

CLINICAL DATA: Right flank pain for several days.

EXAM:
OBSTETRIC <14 WK ULTRASOUND
TECHNIQUE: Transabdominal ultrasound was performed for evaluation of the
gestation as well as the maternal uterus and adnexal regions.

[Series 1: us ob comp less 14 wk · 0.14mm/px · 14 of 104 slices shown]
[im 4/104]
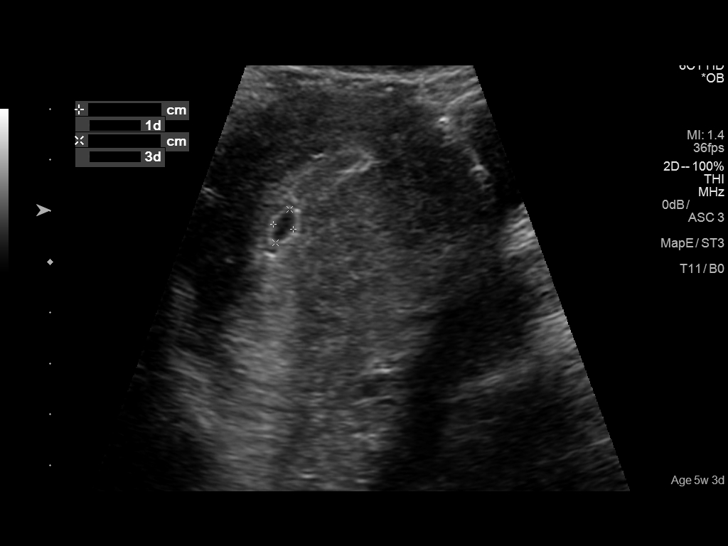
[im 12/104]
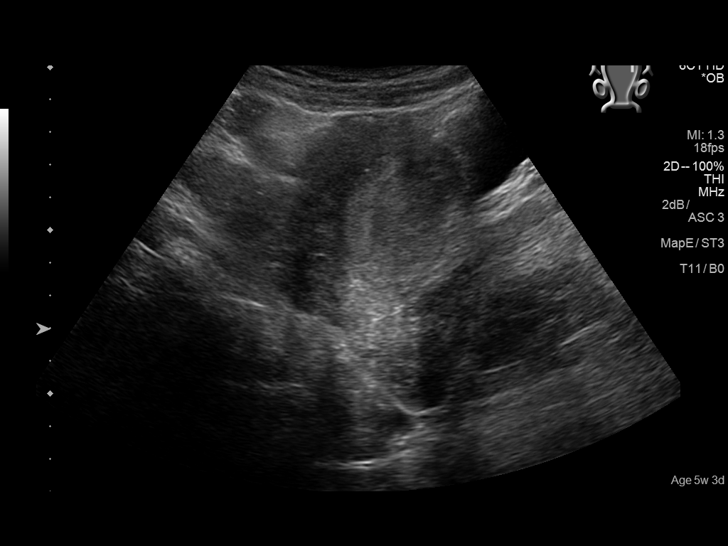
[im 20/104]
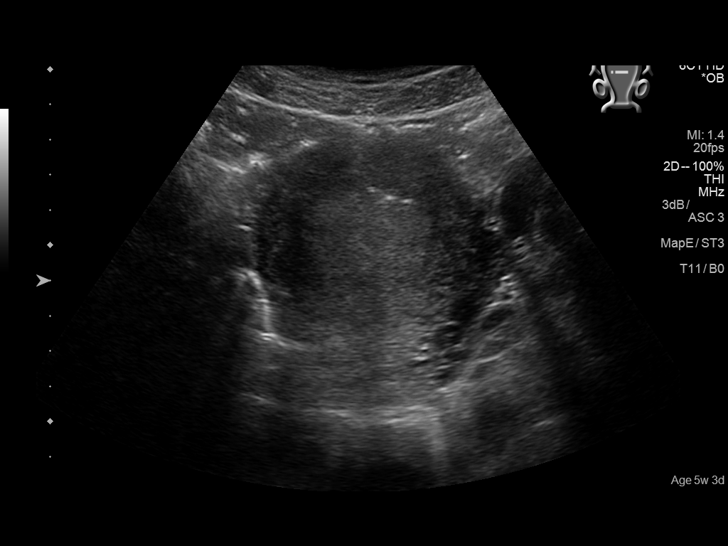
[im 27/104]
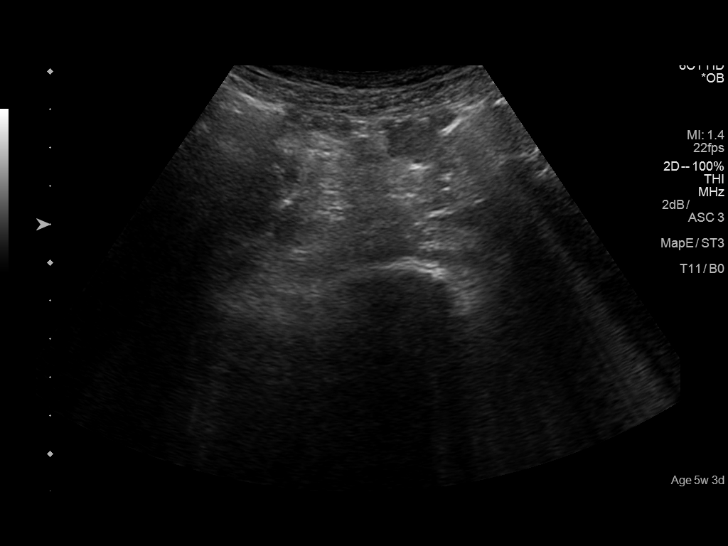
[im 35/104]
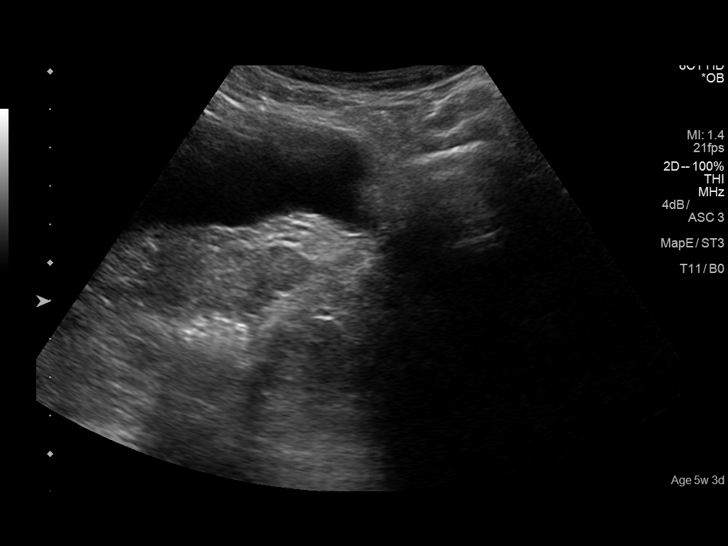
[im 42/104]
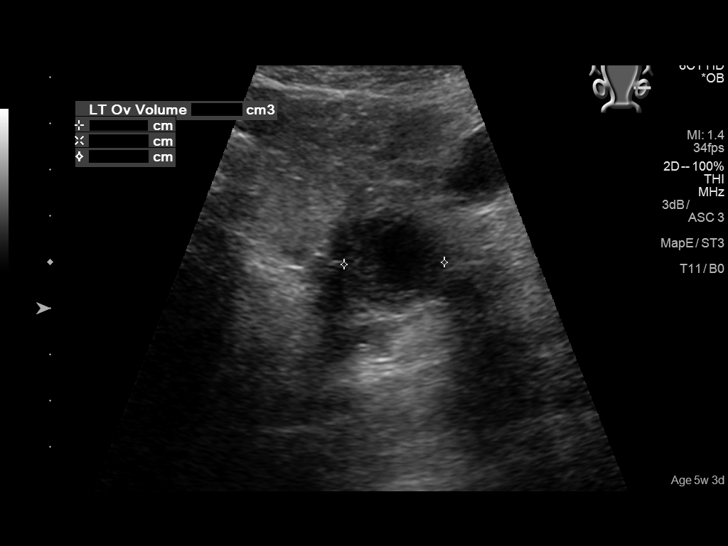
[im 50/104]
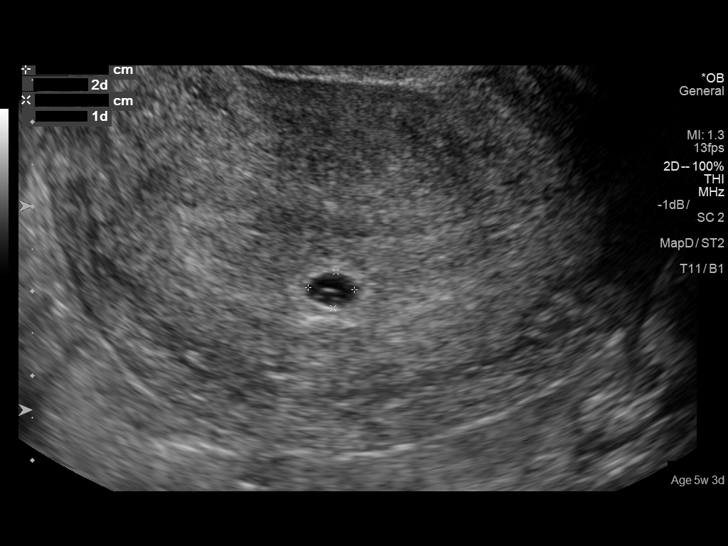
[im 58/104]
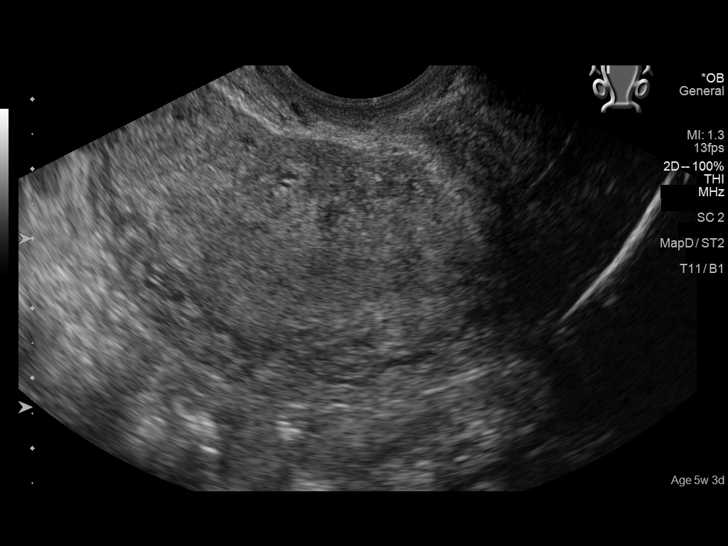
[im 65/104]
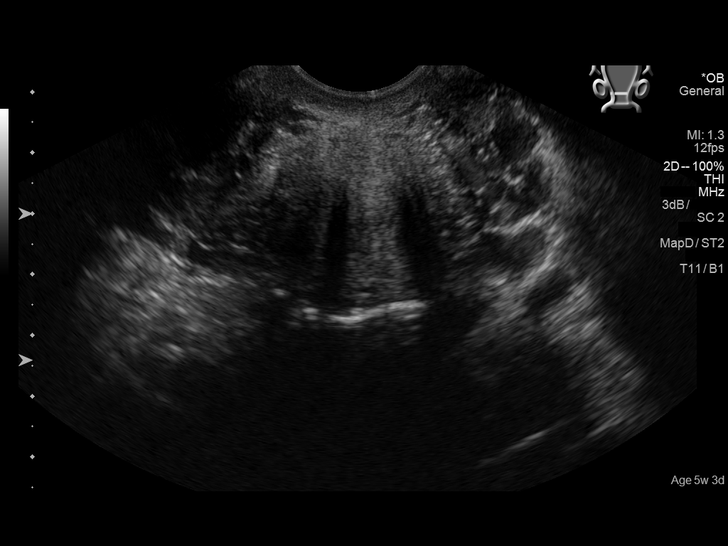
[im 73/104]
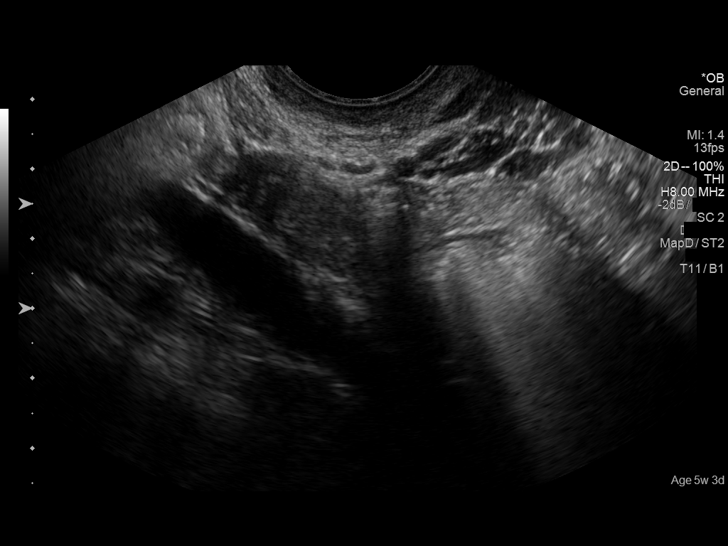
[im 81/104]
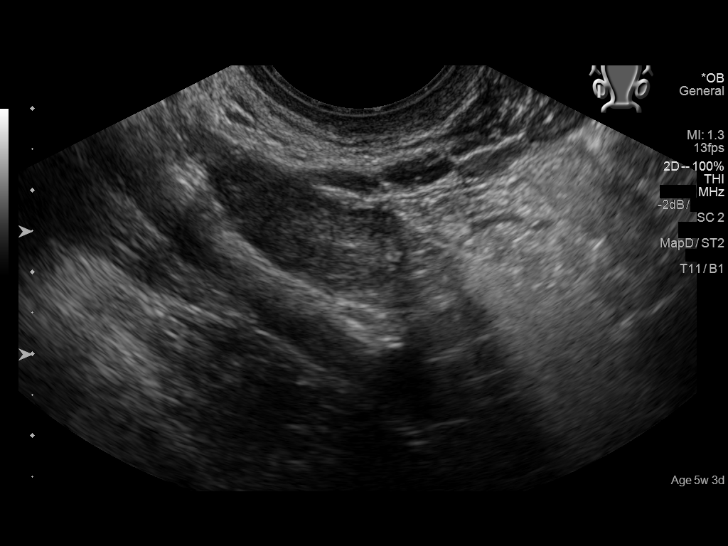
[im 88/104]
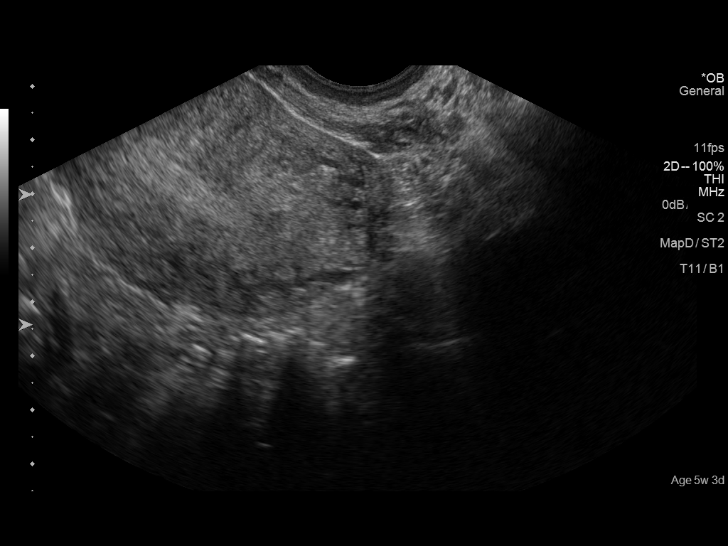
[im 96/104]
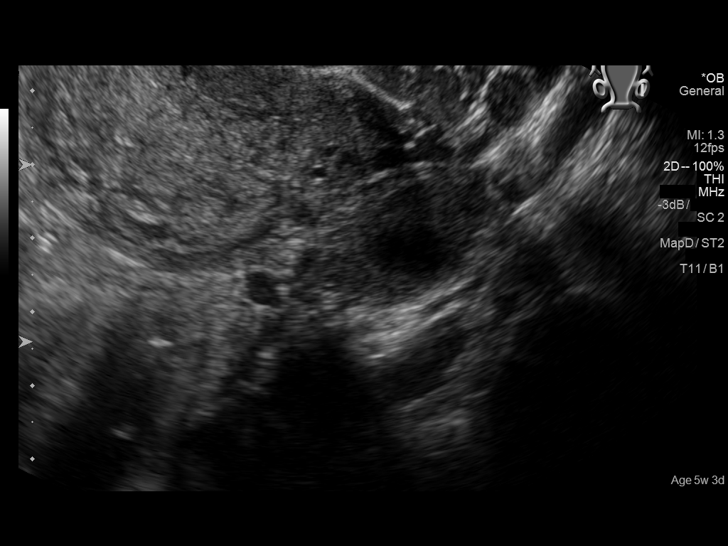
[im 104/104]
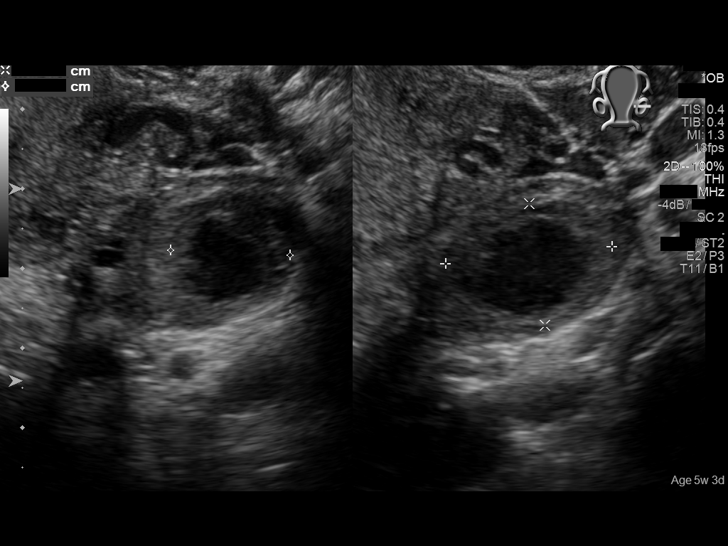

[14 of 28 positions shown; findings below may reference images not displayed]

FINDINGS: Intrauterine gestational sac: Single

Yolk sac:  Visible

Embryo:  Not visible

Cardiac Activity:

Heart Rate:  bpm

MSD: 5  mm   5 w   2  d

CRL:     mm    w  d                  US EDC:

Subchorionic hemorrhage:  None visualized.

Maternal uterus/adnexae: Normal ovaries. No abnormal pelvic fluid
collections.
IMPRESSION: Early intrauterine gestational sac with visible yolk sac, but no
fetal pole or cardiac activity yet visualized. Recommend follow-up
quantitative B-HCG levels and follow-up US in 14 days to assess
viability. This recommendation follows SRU consensus guidelines:
Diagnostic Criteria for Nonviable Pregnancy Early in the First
Trimester. N Engl J Med 3512; [DATE].

## 2019-02-13 IMAGING — US US OB COMP LESS 14 WK
1 series · 14 of 28 positions shown · non-contrast
Comparison: Pelvic ultrasound 11/22/2016

CLINICAL DATA: Pregnant patient with right lower quadrant abdominal
pain for 2 days.

EXAM:
OBSTETRIC <14 WK US AND TRANSVAGINAL OB US
TECHNIQUE: Both transabdominal and transvaginal ultrasound examinations were
performed for complete evaluation of the gestation as well as the
maternal uterus, adnexal regions, and pelvic cul-de-sac.
Transvaginal technique was performed to assess early pregnancy.

[Series 1: us ob comp less 14 wk · 0.11mm/px · 14 of 115 slices shown]
[im 5/115]
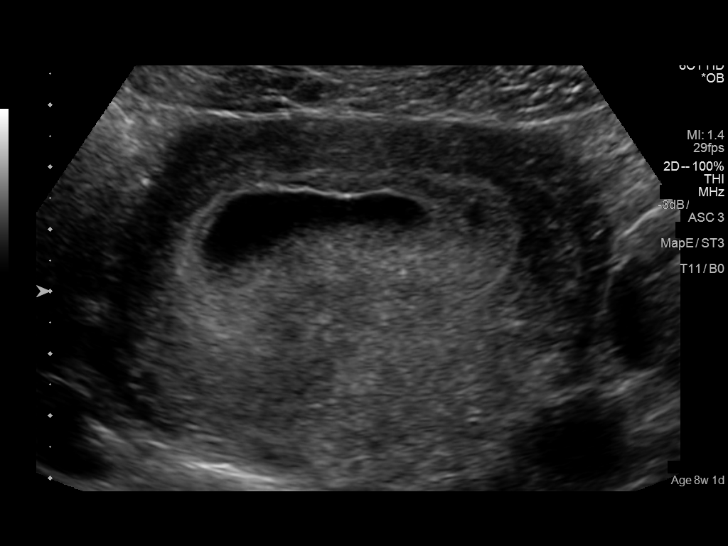
[im 13/115]
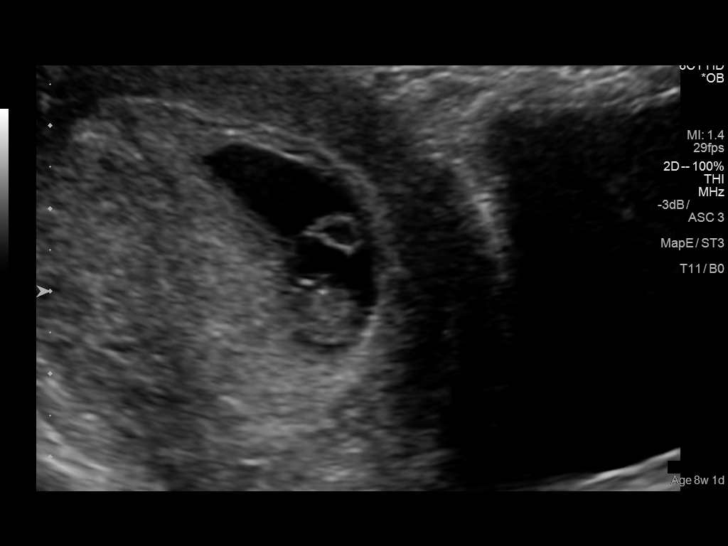
[im 22/115]
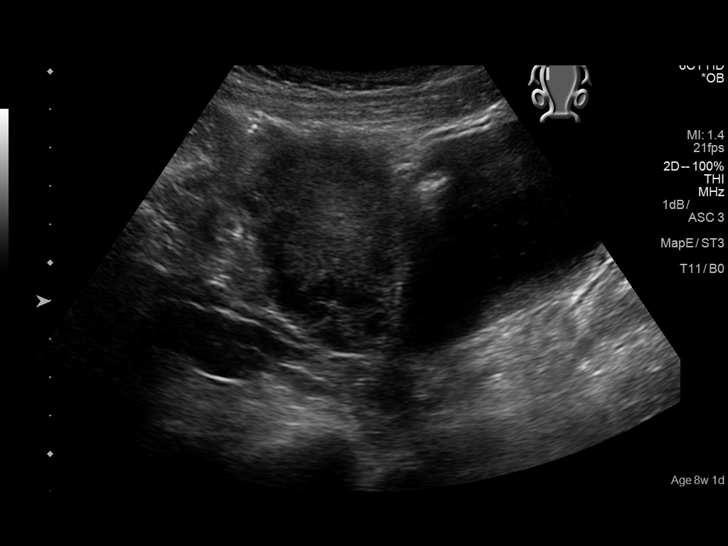
[im 30/115]
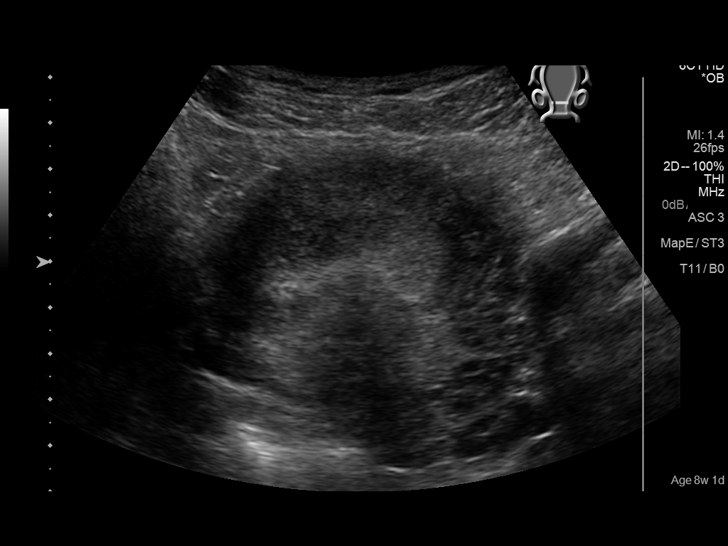
[im 39/115]
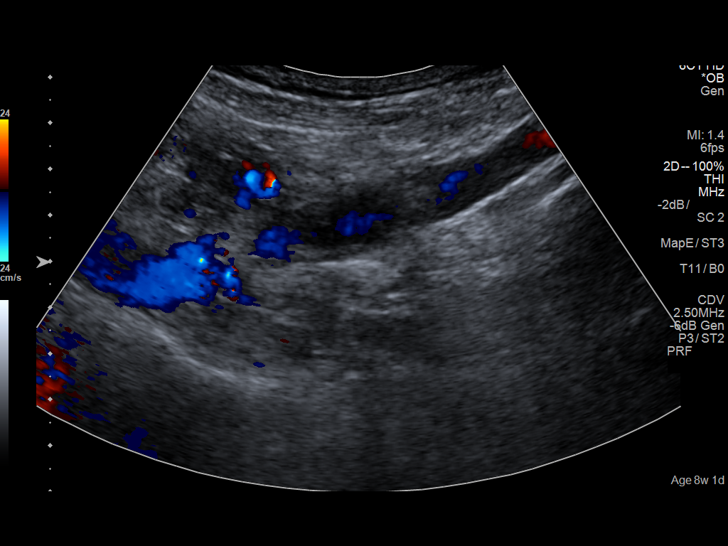
[im 47/115]
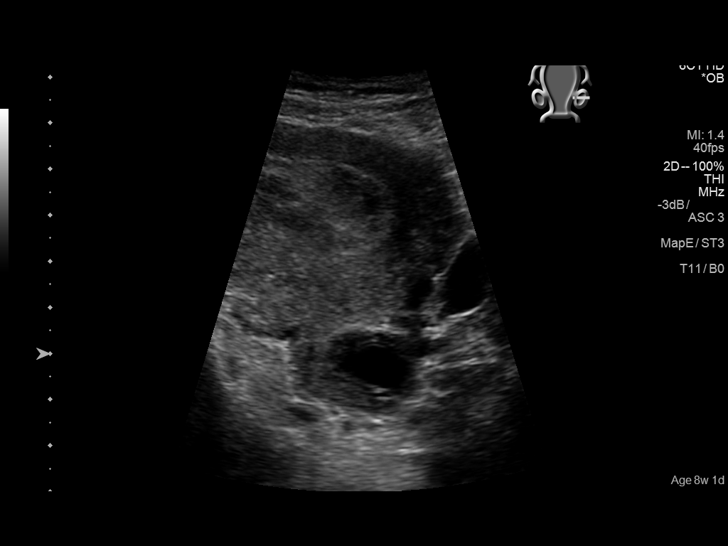
[im 55/115]
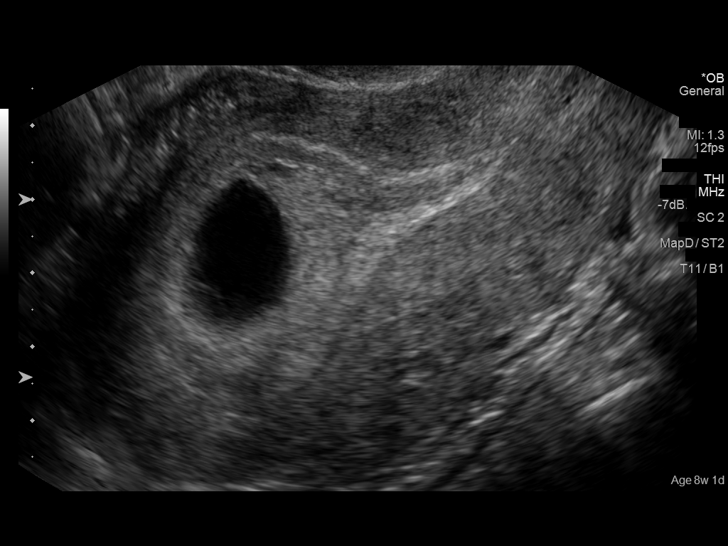
[im 64/115]
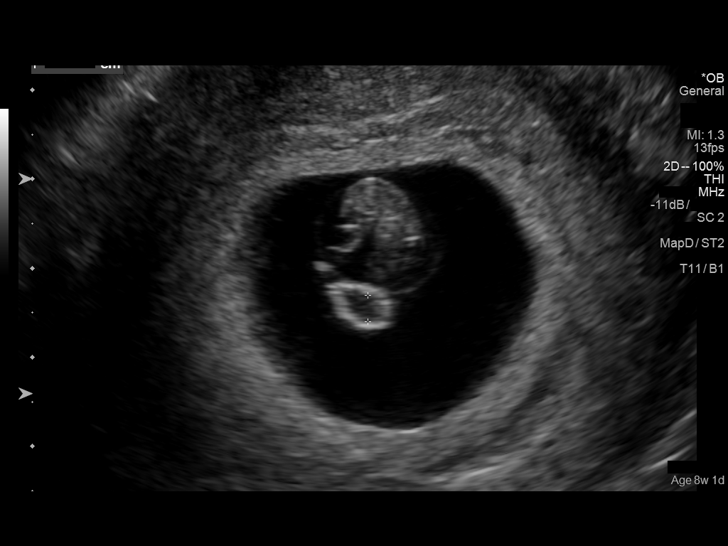
[im 72/115]
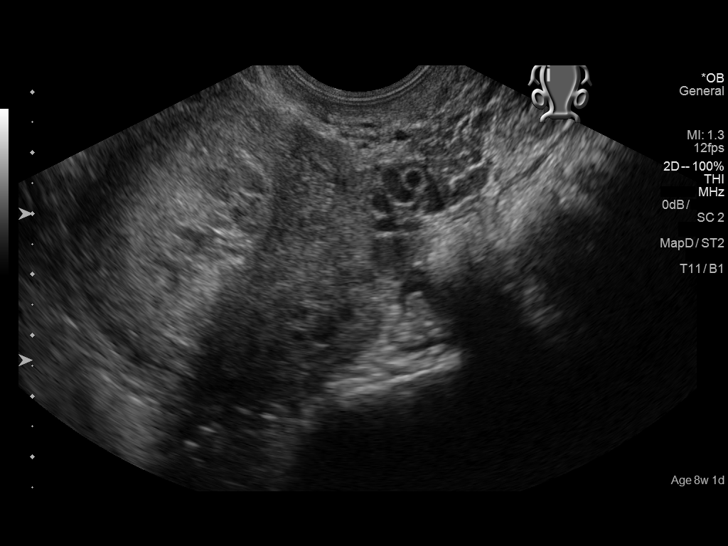
[im 81/115]
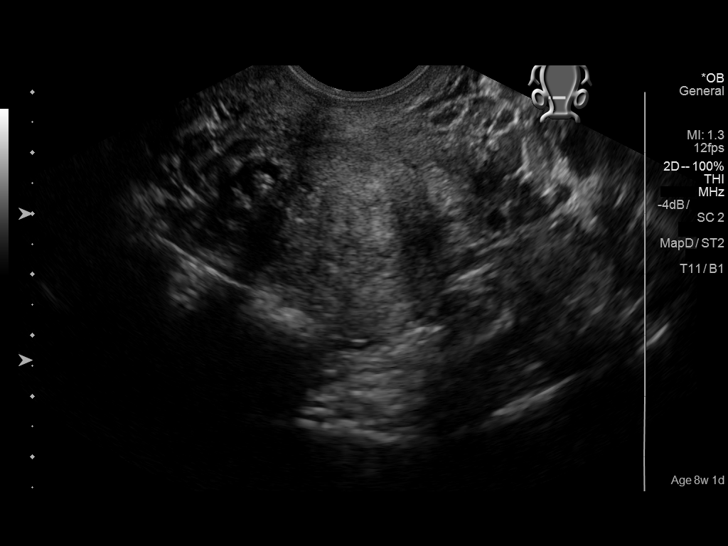
[im 89/115]
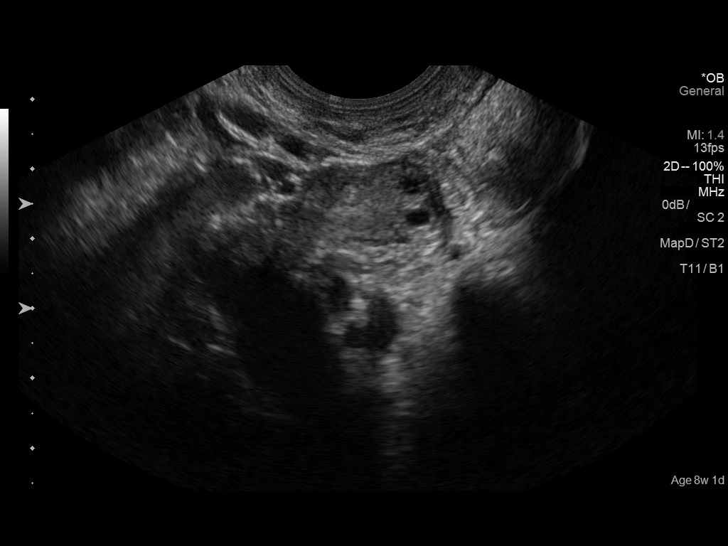
[im 98/115]
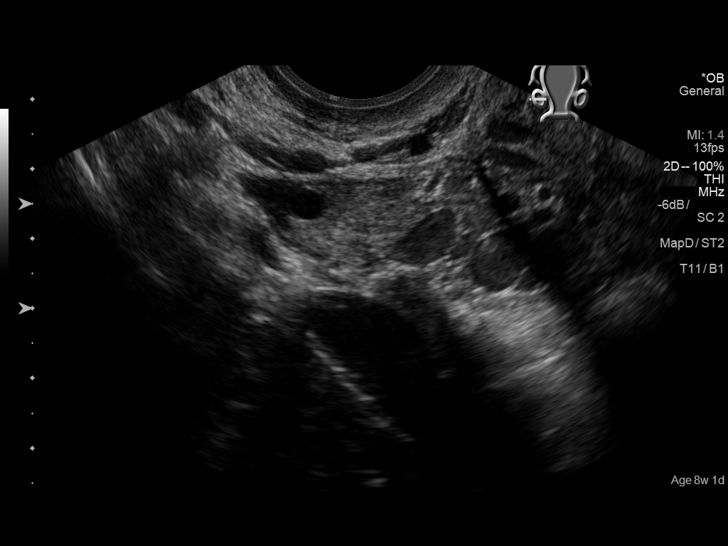
[im 106/115]
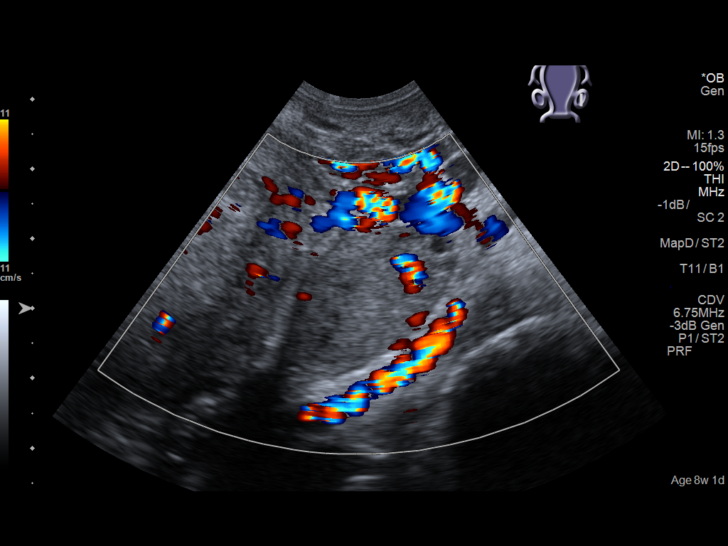
[im 115/115]
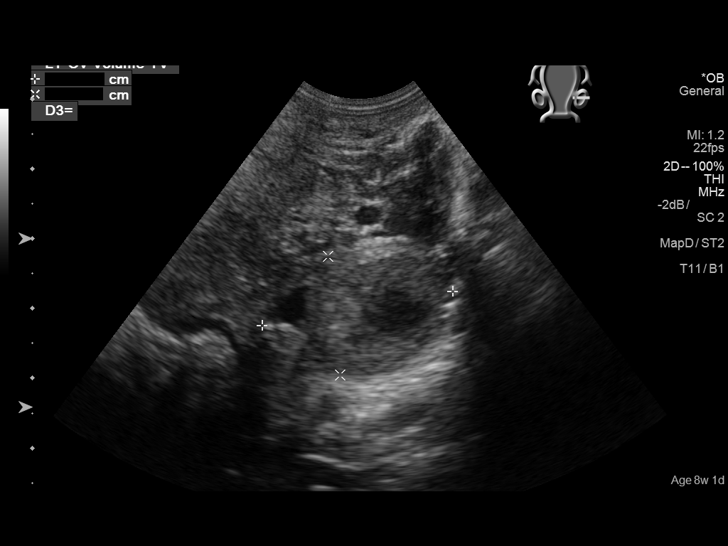

[14 of 28 positions shown; findings below may reference images not displayed]

FINDINGS: Intrauterine gestational sac: Single

Yolk sac:  Visualized.

Embryo:  Visualized.

Cardiac Activity: Visualized.

Heart Rate: 158  bpm

CRL:  14.8  mm   7 w   6 d                  US EDC: 07/24/2017

Subchorionic hemorrhage:  Small

Maternal uterus/adnexae: Probable corpus luteum within the left
ovary. Right ovary is normal. Trace free fluid in the pelvis.
IMPRESSION: Single live intrauterine gestation.  Small subchorionic hemorrhage.

## 2019-05-05 IMAGING — US US MFM OB DETAIL+14 WK
1 series · 13 of 28 positions shown · non-contrast
Comparison: none

PATIENT INFO:

PERFORMED BY:
SERVICE(S) PROVIDED:
INDICATIONS:
19 weeks gestation of pregnancy
ETOH abuse
FETAL EVALUATION:
Num Of Fetuses:     1
Fetal Heart         149
Rate(bpm):
Presentation:       Cephalic
Placenta:           Posterior Grade 0, No previa
P. Cord Insertion:  Normal
BIOMETRY:
BPD:      44.3  mm     G. Age:  19w 3d         36  %    CI:        78.63   %    70 - 86
FL/HC:       18.5  %    16.8 -
HC:       158   mm     G. Age:  18w 5d          7  %    HC/AC:       1.08       1.09 -
AC:      146.9  mm     G. Age:  20w 0d         54  %    FL/BPD:      66.1  %
FL:       29.3  mm     G. Age:  19w 0d         20  %    FL/AC:       19.9  %    20 - 24
CER:      19.1  mm     G. Age:  18w 4d         18  %
NFT:       3.9  mm
CM:        3.4  mm
Est. FW:     293   gm   0 lb 10 oz      34  %
GESTATIONAL AGE:
LMP:           19w 5d        Date:  10/15/16                 EDD:   07/22/17
U/S Today:     19w 2d                                        EDD:   07/25/17
Best:          19w 5d     Det. By:  LMP  (10/15/16)          EDD:   07/22/17
ANATOMY:
Cranium:               Within Normal Limits   Aortic Arch:            Normal appearance
Cavum:                 CSP visualized         Ductal Arch:            Normal appearance
Ventricles:            Normal appearance      Diaphragm:              Within Normal Limits
Choroid Plexus:        Within Normal Limits   Stomach:                Seen
Cerebellum:            Within Normal Limits   Abdomen:                Within Normal
Limits
Posterior Fossa:       Within Normal Limits   Abdominal Wall:         Normal appearance
Nuchal Fold:           Within Normal Limits   Cord Vessels:           3 vessels
Face:                  Orbits visualized      Kidneys:                Normal appearance
Lips:                  Normal appearance      Bladder:                Seen
Thoracic:              Within Normal Limits   Spine:                  Normal appearance
Heart:                 4-Chamber view         Upper Extremities:      Visualized
appears normal
RVOT:                  Normal appearance      Lower Extremities:      Visualized
LVOT:                  Normal appearance
CERVIX UTERUS ADNEXA:
Cervix
Length:           4.09  cm.

[Series 1: us mfm ob detail+14 wk · 0.23mm/px · 13 of 95 slices shown]
[im 4/95]
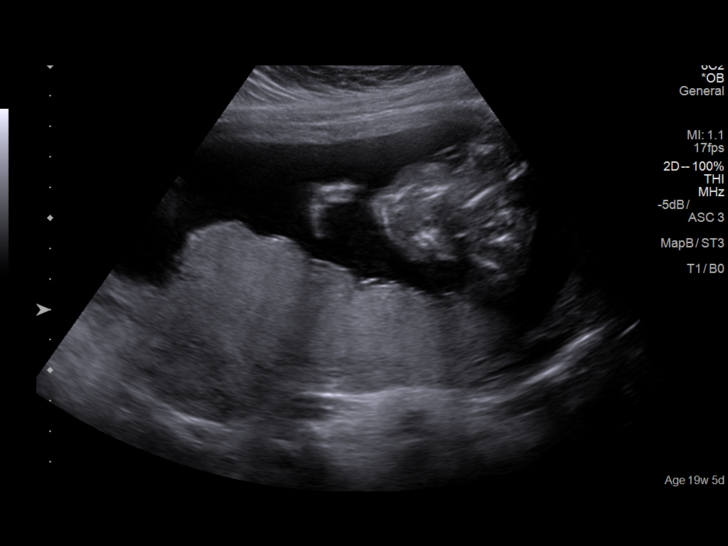
[im 11/95]
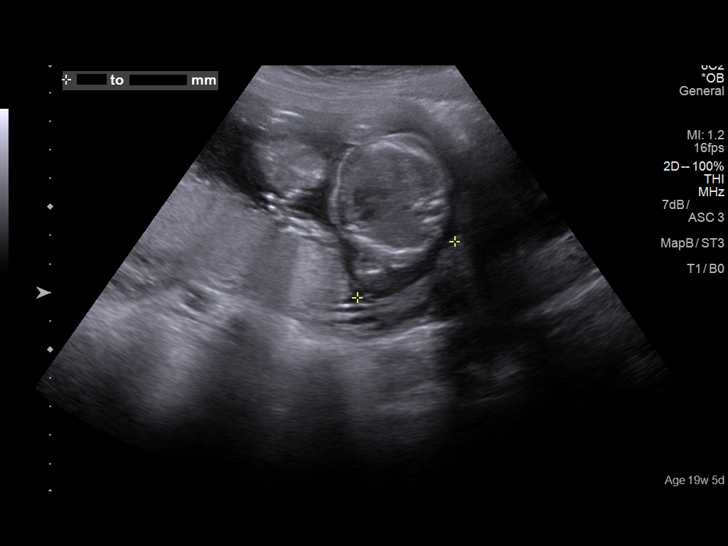
[im 18/95]
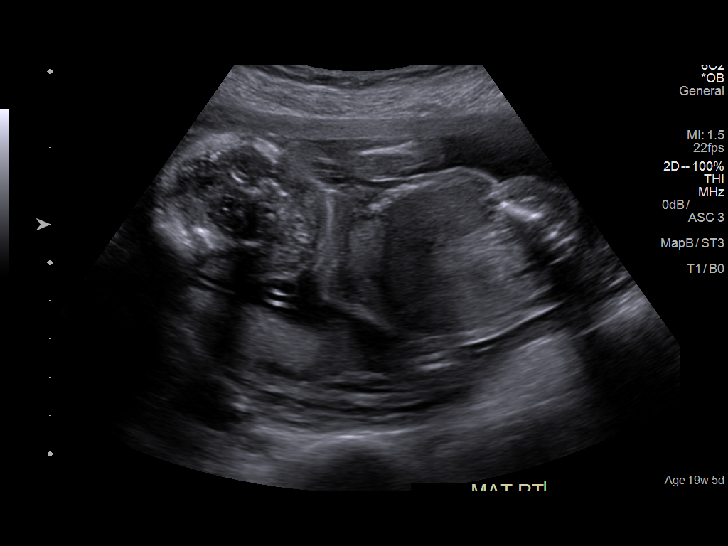
[im 25/95]
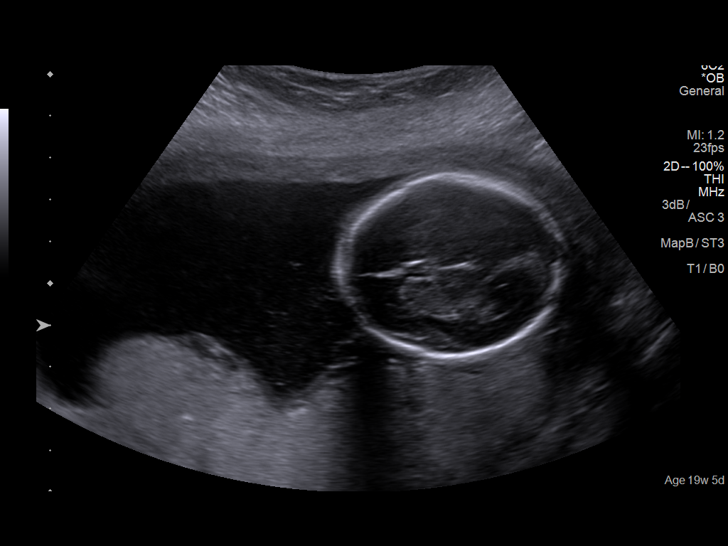
[im 32/95]
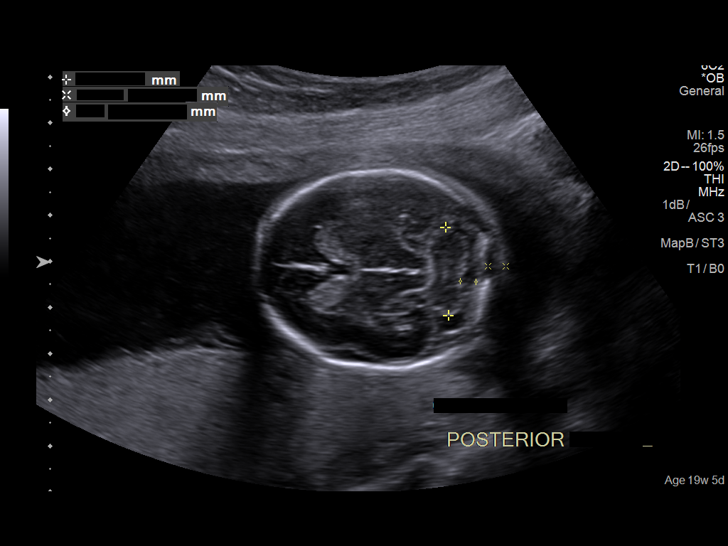
[im 39/95]
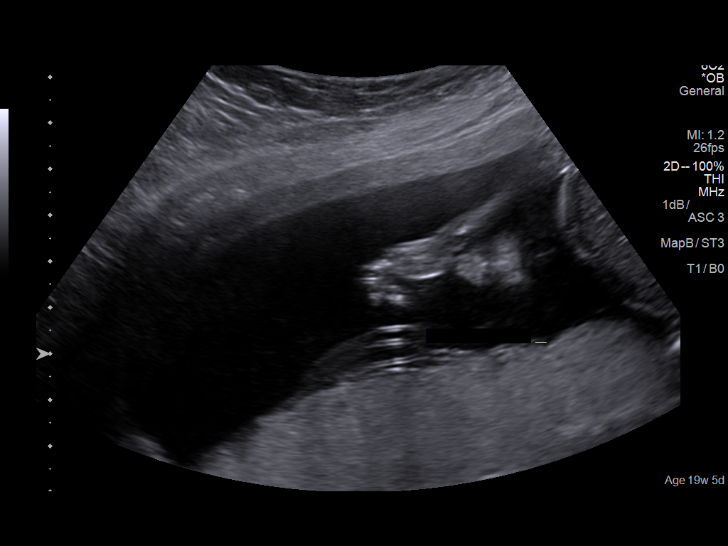
[im 49/95]
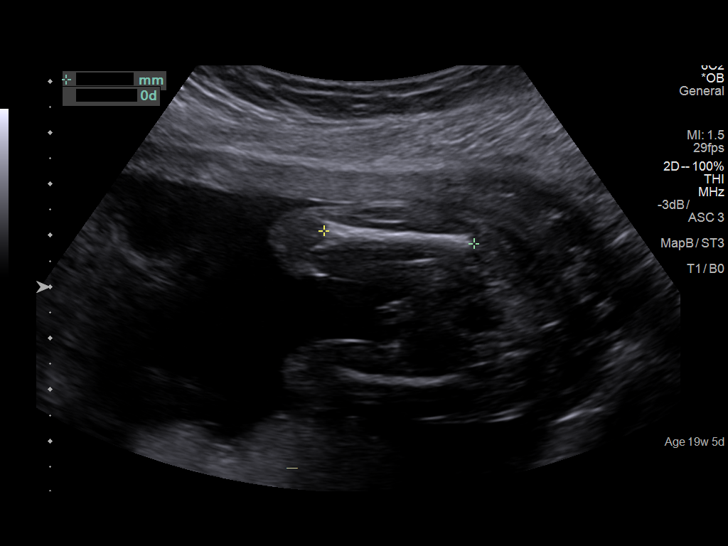
[im 56/95]
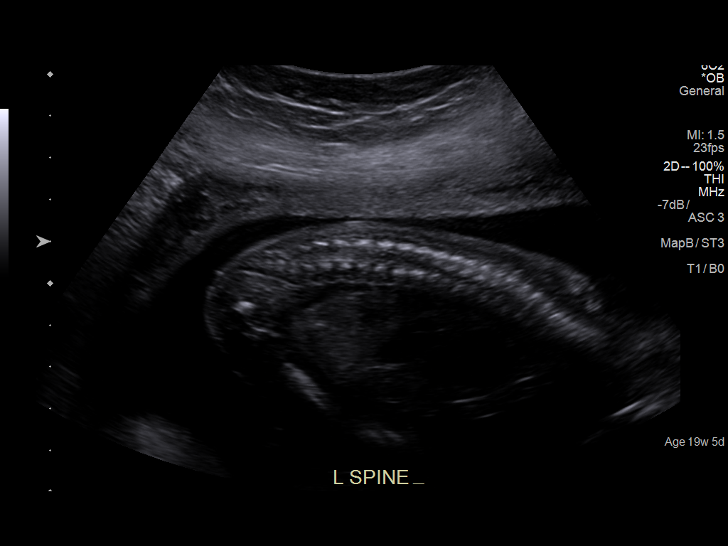
[im 63/95]
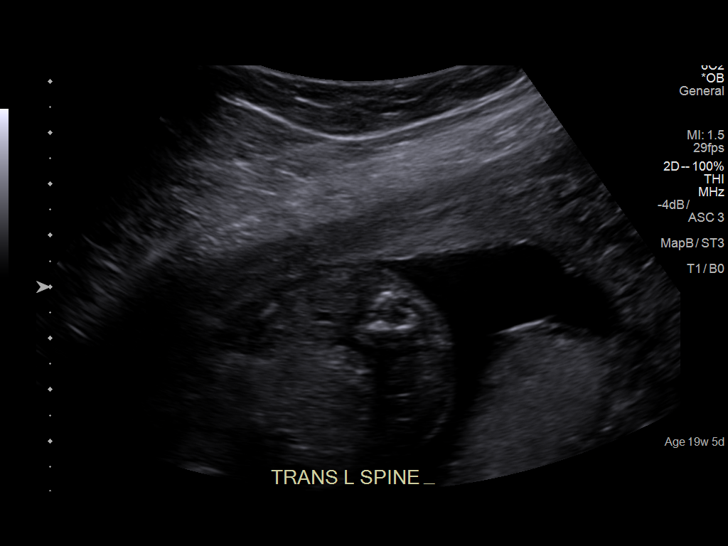
[im 70/95]
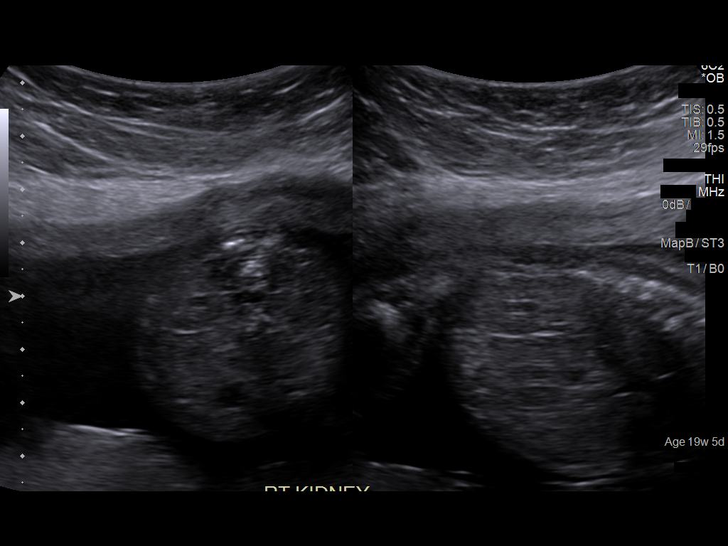
[im 77/95]
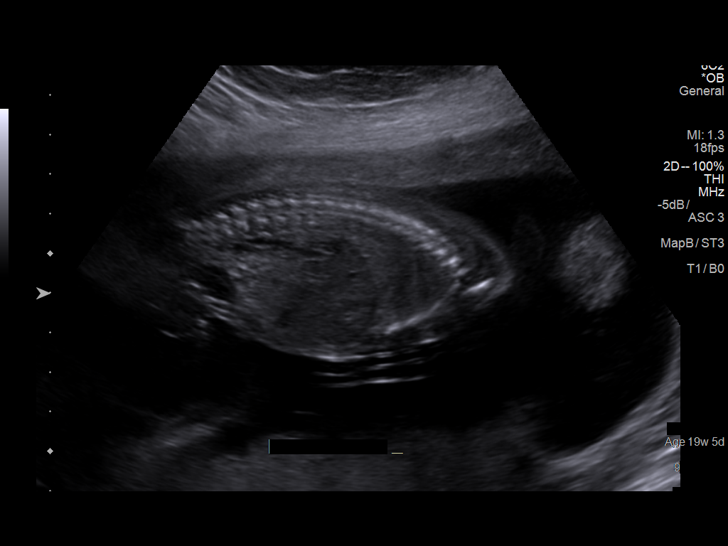
[im 84/95]
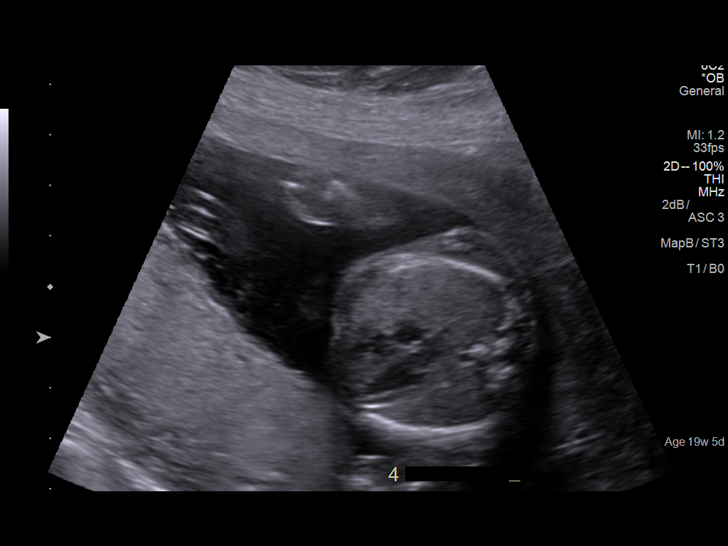
[im 91/95]
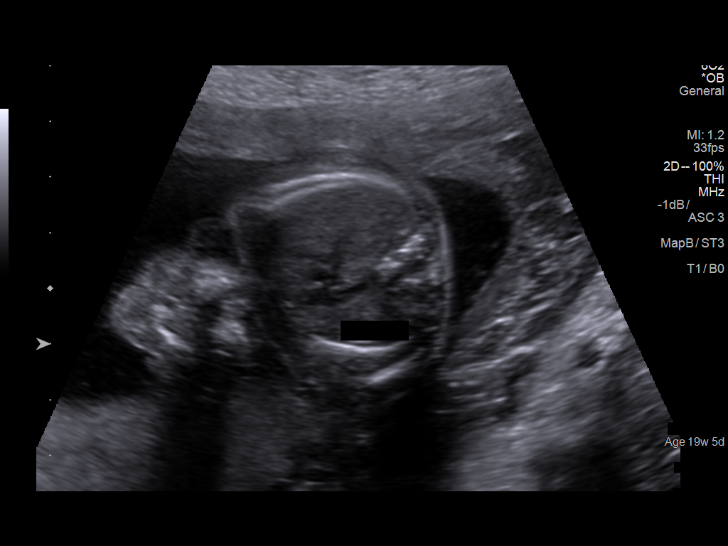

[13 of 28 positions shown; findings below may reference images not displayed]

IMPRESSION: Dear Dr.   AMNERIS ,

Thank you for referring your patient to [facility] for a
detailed fetal anatomical survey due to alcohol exposure.

There is a singleton gestation with subjectively normal
amniotic fluid volume.

The fetal biometry correlates with established dating.

Detailed evaluation of the fetal anatomy was performed.
The fetal anatomical survey appears within normal limits
within the resolution of ultrasound as described above.

It must be noted that a normal ultrasound cannot rule out
aneuploidy.

Thank you for allowing us to participate in your patient's care.
assistance.
